# Patient Record
Sex: Male | Born: 1955 | Race: Black or African American | Hispanic: No | Marital: Married | Smoking: Former smoker
Health system: Southern US, Community
[De-identification: ages and names within clinical notes are randomized; demographics above are authoritative.]

## PROBLEM LIST (undated history)

## (undated) ENCOUNTER — Encounter

## (undated) ENCOUNTER — Other Ambulatory Visit

## (undated) ENCOUNTER — Telehealth

## (undated) ENCOUNTER — Inpatient Hospital Stay

## (undated) DIAGNOSIS — H269 Unspecified cataract: Secondary | ICD-10-CM

## (undated) DIAGNOSIS — E785 Hyperlipidemia, unspecified: Secondary | ICD-10-CM

## (undated) DIAGNOSIS — B351 Tinea unguium: Secondary | ICD-10-CM

## (undated) DIAGNOSIS — H409 Unspecified glaucoma: Secondary | ICD-10-CM

## (undated) DIAGNOSIS — I10 Essential (primary) hypertension: Secondary | ICD-10-CM

## (undated) DIAGNOSIS — R1900 Intra-abdominal and pelvic swelling, mass and lump, unspecified site: Secondary | ICD-10-CM

## (undated) HISTORY — DX: Intra-abdominal and pelvic swelling, mass and lump, unspecified site: R19.00

## (undated) HISTORY — DX: Tinea unguium: B35.1

## (undated) HISTORY — PX: APPENDECTOMY: SHX54

## (undated) HISTORY — DX: Unspecified glaucoma: H40.9

## (undated) HISTORY — DX: Unspecified cataract: H26.9

## (undated) HISTORY — DX: Hyperlipidemia, unspecified: E78.5

## (undated) HISTORY — PX: CATARACT EXTRACTION: SUR2

## (undated) HISTORY — DX: Essential (primary) hypertension: I10

## (undated) HISTORY — PX: TONSILLECTOMY: SHX5217

---

## 1993-04-28 HISTORY — PX: APPENDECTOMY: SHX54

## 1998-04-28 HISTORY — PX: COLONOSCOPY: SHX174

## 2001-04-28 HISTORY — PX: CATARACT EXTRACTION: SUR2

## 2016-02-09 DIAGNOSIS — E785 Hyperlipidemia, unspecified: Principal | ICD-10-CM

## 2016-02-11 MED ORDER — NIACIN ER (ANTIHYPERLIPIDEMIC) 750 MG PO TBCR
1 refills
Start: 2016-02-11 — End: ?

## 2016-02-11 MED ORDER — SIMVASTATIN 40 MG PO TABS
1 refills
Start: 2016-02-11 — End: ?

## 2016-02-13 ENCOUNTER — Ambulatory Visit: Attending: Family | Primary: Family Medicine

## 2016-02-13 DIAGNOSIS — Z125 Encounter for screening for malignant neoplasm of prostate: Secondary | ICD-10-CM

## 2016-02-13 DIAGNOSIS — I10 Essential (primary) hypertension: Principal | ICD-10-CM

## 2016-02-13 DIAGNOSIS — Z0000 Encounter for general adult medical examination without abnormal findings: Secondary | ICD-10-CM

## 2016-02-13 DIAGNOSIS — E7800 Pure hypercholesterolemia, unspecified: Principal | ICD-10-CM

## 2016-02-13 DIAGNOSIS — E785 Hyperlipidemia, unspecified: Secondary | ICD-10-CM

## 2016-02-13 NOTE — Progress Notes
Reason:  Physician ordered labs  Amount:  3 Tubes  Type:  Butterfly  Site:  Vein  right arm  Reaction:  None    Draw performed by:  [id number],  02/13/2016

## 2016-02-13 NOTE — Progress Notes
No teaching statement needed

## 2016-02-18 DIAGNOSIS — I10 Essential (primary) hypertension: Principal | ICD-10-CM

## 2016-02-18 MED ORDER — HYDROCHLOROTHIAZIDE 12.5 MG PO CAPS
1 refills | Status: CP
Start: 2016-02-18 — End: 2017-06-08

## 2016-02-18 MED ORDER — LOSARTAN POTASSIUM 100 MG PO TABS
1 refills | Status: CP
Start: 2016-02-18 — End: 2017-06-08

## 2016-02-19 ENCOUNTER — Ambulatory Visit: Attending: Family Medicine | Primary: Family Medicine

## 2016-02-19 DIAGNOSIS — Z0000 Encounter for general adult medical examination without abnormal findings: Principal | ICD-10-CM

## 2016-02-19 DIAGNOSIS — Z6834 Body mass index (BMI) 34.0-34.9, adult: Secondary | ICD-10-CM

## 2016-02-19 DIAGNOSIS — Z1211 Encounter for screening for malignant neoplasm of colon: Secondary | ICD-10-CM

## 2016-02-19 DIAGNOSIS — J309 Allergic rhinitis, unspecified: Secondary | ICD-10-CM

## 2016-02-19 DIAGNOSIS — I10 Essential (primary) hypertension: Secondary | ICD-10-CM

## 2016-02-19 DIAGNOSIS — E785 Hyperlipidemia, unspecified: Secondary | ICD-10-CM

## 2016-02-19 DIAGNOSIS — B354 Tinea corporis: Secondary | ICD-10-CM

## 2016-02-19 MED ORDER — CLOTRIMAZOLE-BETAMETHASONE 1-0.05 % EX CREA
1 refills | Status: CP
Start: 2016-02-19 — End: 2017-06-08

## 2016-02-19 NOTE — Patient Instructions
Initiate daily nasal steroid for the next 2 weeks (Flonase/Nasonex) to reduce inflammation and provide symptomatic relief of allergies.     Exercising to Lose Weight  Exercising can help you to lose weight. In order to lose weight through exercise, you need to do vigorous-intensity exercise. You can tell that you are exercising with vigorous intensity if you are breathing very hard and fast and cannot hold a conversation while exercising.  Moderate-intensity exercise helps to maintain your current weight. You can tell that you are exercising at a moderate level if you have a higher heart rate and faster breathing, but you are still able to hold a conversation.  HOW OFTEN SHOULD I EXERCISE?  Choose an activity that you enjoy and set realistic goals. Your health care provider can help you to make an activity plan that works for you. Exercise regularly as directed by your health care provider. This may include:  ? Doing resistance training twice each week, such as:    Push-ups.    Sit-ups.    Lifting weights.    Using resistance bands.  ? Doing a given intensity of exercise for a given amount of time. Choose from these options:    150 minutes of moderate-intensity exercise every week.    75 minutes of vigorous-intensity exercise every week.    A mix of moderate-intensity and vigorous-intensity exercise every week.  Children, pregnant women, people who are out of shape, people who are overweight, and older adults may need to consult a health care provider for individual recommendations. If you have any sort of medical condition, be sure to consult your health care provider before starting a new exercise program.  WHAT ARE SOME ACTIVITIES THAT CAN HELP ME TO LOSE WEIGHT?   ? Walking at a rate of at least 4.5 miles an hour.  ? Jogging or running at a rate of 5 miles per hour.  ? Biking at a rate of at least 10 miles per hour.  ? Lap swimming.  ? Roller-skating or in-line skating.  ? Cross-country skiing.

## 2016-02-19 NOTE — Patient Instructions
Initiate nasal steroid (Flonase/Nasonex) to reduce inflammation and provide symptomatic relief of allergies.     Exercising to Lose Weight  Exercising can help you to lose weight. In order to lose weight through exercise, you need to do vigorous-intensity exercise. You can tell that you are exercising with vigorous intensity if you are breathing very hard and fast and cannot hold a conversation while exercising.  Moderate-intensity exercise helps to maintain your current weight. You can tell that you are exercising at a moderate level if you have a higher heart rate and faster breathing, but you are still able to hold a conversation.  HOW OFTEN SHOULD I EXERCISE?  Choose an activity that you enjoy and set realistic goals. Your health care provider can help you to make an activity plan that works for you. Exercise regularly as directed by your health care provider. This may include:  ? Doing resistance training twice each week, such as:    Push-ups.    Sit-ups.    Lifting weights.    Using resistance bands.  ? Doing a given intensity of exercise for a given amount of time. Choose from these options:    150 minutes of moderate-intensity exercise every week.    75 minutes of vigorous-intensity exercise every week.    A mix of moderate-intensity and vigorous-intensity exercise every week.  Children, pregnant women, people who are out of shape, people who are overweight, and older adults may need to consult a health care provider for individual recommendations. If you have any sort of medical condition, be sure to consult your health care provider before starting a new exercise program.  WHAT ARE SOME ACTIVITIES THAT CAN HELP ME TO LOSE WEIGHT?   ? Walking at a rate of at least 4.5 miles an hour.  ? Jogging or running at a rate of 5 miles per hour.  ? Biking at a rate of at least 10 miles per hour.  ? Lap swimming.  ? Roller-skating or in-line skating.  ? Cross-country skiing.

## 2016-02-19 NOTE — Patient Instructions
?   Vigorous competitive sports, such as football, basketball, and soccer.  ? Jumping rope.  ? Aerobic dancing.  HOW CAN I BE MORE ACTIVE IN MY DAY-TO-DAY ACTIVITIES?  ? Use the stairs instead of the elevator.  ? Take a walk during your lunch break.  ? If you drive, park your car farther away from work or school.  ? If you take public transportation, get off one stop early and walk the rest of the way.  ? Make all of your phone calls while standing up and walking around.  ? Get up, stretch, and walk around every 30 minutes throughout the day.  WHAT GUIDELINES SHOULD I FOLLOW WHILE EXERCISING?  ? Do not exercise so much that you hurt yourself, feel dizzy, or get very short of breath.  ? Consult your health care provider prior to starting a new exercise program.  ? Wear comfortable clothes and shoes with good support.  ? Drink plenty of water while you exercise to prevent dehydration or heat stroke. Body water is lost during exercise and must be replaced.  ? Work out until you breathe faster and your heart beats faster.     This information is not intended to replace advice given to you by your health care provider. Make sure you discuss any questions you have with your health care provider.     Document Released: 05/17/2010 Document Revised: 05/05/2014 Document Reviewed: 09/15/2013  Elsevier Interactive Patient Education ?2017 [employer]

## 2016-02-19 NOTE — Patient Instructions
Exercising to Lose Weight  Exercising can help you to lose weight. In order to lose weight through exercise, you need to do vigorous-intensity exercise. You can tell that you are exercising with vigorous intensity if you are breathing very hard and fast and cannot hold a conversation while exercising.  Moderate-intensity exercise helps to maintain your current weight. You can tell that you are exercising at a moderate level if you have a higher heart rate and faster breathing, but you are still able to hold a conversation.  HOW OFTEN SHOULD I EXERCISE?  Choose an activity that you enjoy and set realistic goals. Your health care provider can help you to make an activity plan that works for you. Exercise regularly as directed by your health care provider. This may include:  ? Doing resistance training twice each week, such as:    Push-ups.    Sit-ups.    Lifting weights.    Using resistance bands.  ? Doing a given intensity of exercise for a given amount of time. Choose from these options:    150 minutes of moderate-intensity exercise every week.    75 minutes of vigorous-intensity exercise every week.    A mix of moderate-intensity and vigorous-intensity exercise every week.  Children, pregnant women, people who are out of shape, people who are overweight, and older adults may need to consult a health care provider for individual recommendations. If you have any sort of medical condition, be sure to consult your health care provider before starting a new exercise program.  WHAT ARE SOME ACTIVITIES THAT CAN HELP ME TO LOSE WEIGHT?   ? Walking at a rate of at least 4.5 miles an hour.  ? Jogging or running at a rate of 5 miles per hour.  ? Biking at a rate of at least 10 miles per hour.  ? Lap swimming.  ? Roller-skating or in-line skating.  ? Cross-country skiing.  ? Vigorous competitive sports, such as football, basketball, and soccer.  ? Jumping rope.  ? Aerobic dancing.

## 2016-02-19 NOTE — Patient Instructions
HOW CAN I BE MORE ACTIVE IN MY DAY-TO-DAY ACTIVITIES?  ? Use the stairs instead of the elevator.  ? Take a walk during your lunch break.  ? If you drive, park your car farther away from work or school.  ? If you take public transportation, get off one stop early and walk the rest of the way.  ? Make all of your phone calls while standing up and walking around.  ? Get up, stretch, and walk around every 30 minutes throughout the day.  WHAT GUIDELINES SHOULD I FOLLOW WHILE EXERCISING?  ? Do not exercise so much that you hurt yourself, feel dizzy, or get very short of breath.  ? Consult your health care provider prior to starting a new exercise program.  ? Wear comfortable clothes and shoes with good support.  ? Drink plenty of water while you exercise to prevent dehydration or heat stroke. Body water is lost during exercise and must be replaced.  ? Work out until you breathe faster and your heart beats faster.     This information is not intended to replace advice given to you by your health care provider. Make sure you discuss any questions you have with your health care provider.     Document Released: 05/17/2010 Document Revised: 05/05/2014 Document Reviewed: 09/15/2013  Elsevier Interactive Patient Education ?2017 Else[employer] Inc.

## 2016-02-22 NOTE — Progress Notes
?   clotrimazole-betamethasone (LOTRISONE) 1-0.05 % Cream     Sig: Apply topically 2 times daily     Dispense:  1 Tube     Refill:  1     Orders Placed This Encounter   Procedures   ? IMMUNOCHEMICAL FECAL OCCULT BLOOD-SCREEN     Plan     Encounter for annual physical exam    Hyperlipidemia  Will discontinue current medication regimen and check lipid panel q3 months. Patient encouraged to exercise and reduce CV risk factors.     Essential hypertension  Antihypertensive medications are needed if systolic BP is persistently ?140 mmHg (in patients <60 years) or ?150 mmHg (in patients > 60 years) and/or the diastolic pressure is persistently ?90 mmHg despite attempted nonpharmacologic therapy. Today, the patient's blood pressure is at goal. Will continue to encourage lifestyle modifications and will discontinue medication regimen at this time. Will continue to monitor closely.    Allergic rhinitis  Reccomended the pt initiate using daily nasal steroid (Flonase) q2 weeks to reduce inflammation and provide symptomatic relief.     Colon cancer screening  The risks, benefits and alternatives of a colonoscopy were explained in detail to the patient. Risks including but not limited to bleeding, perforation, adverse reaction to medications, cardiopulmonary compromise and their attendant sequalae explained. Sequelae including but not limited to need for surgery, colostomy, prolonged hospital stay, placement of drainage tubes, blood transfusions, disability, deformity, morbidity and mortality was explained. The risks and benefits of FIT testing were discussed and the patient would like to proceed with the annual less invasive screening option.     Tinea corporis  Initiated topical Lotrisone for management of suspected fungal infection and to reduce inflammation to provide symptomatic relief.     BMI 34.0-34.9, adult   Discussed benefits of maintaining a healthy weight with the patient.

## 2016-02-22 NOTE — Progress Notes
work), pruritic rash on right thigh     Physical Exam        VITAL SIGNS (all recorded)      Clinic Vitals       02/19/16 1425             Amb Encounter Vitals    Weight 111.1 kg (245 lb)    -CJ at 02/19/16 1426       Height 1.803 m (5\' 11" )    -CJ at 02/19/16 1426       BMI (Calculated) 34.24    -CJ at 02/19/16 1426       BSA (Calculated - sq m) 2.36    -CJ at 02/19/16 1426       BP 117/78    -CJ at 02/19/16 1426       BP Location Right upper arm    -CJ at 02/19/16 1426       Position Sitting    -CJ at 02/19/16 1426       Pulse 69    -CJ at 02/19/16 1426       Pulse Source Other (Comment)    -CJ at 02/19/16 1426       Pulse Quality Normal    -CJ at 02/19/16 1426       Resp 16    -CJ at 02/19/16 1426       Respiration Quality Normal    -CJ at 02/19/16 1426       Temp 36.2 ?C (97.2 ?F)    -CJ at 02/19/16 1426       Temperature Source Oral    -CJ at 02/19/16 1426       O2 Saturation 97 %    -CJ at 02/19/16 1426       FiO2 Source RA    -CJ at 02/19/16 1426       Pain Score Zero    -CJ at 02/19/16 1426       Education/Communication Barriers?    Learning/Communication Barriers? No    -CJ at 02/19/16 1426       Fall Risk Assessment    Had recent fall / Last 6 months? No recent fall    -CJ at 02/19/16 1426       Does patient have a fear of falling? No    -CJ at 02/19/16 1426         User Key  (r) = Recorded By, (t) = Taken By, (c) = Cosigned By    Initials Name Effective Dates    Colin InaJ Jackson, [place], [place] 07/31/15 -           Physical Exam  Constitution: Well developed, obese, appears stated age, ambulated from waiting area in no acute distress   HEENT: Lids without erythema or lesion, conjunctivae pink, sclera white. External ears symmetric without lesions or deformity. Hears soft spoken voice without difficulty, nares patent with pink mucosa, no discharge or sinus tenderness. Oropharynx without lesions, erythema or exudate. Bilateral swollen turbinates.   NECK: Supple without stridor, thyromegaly, nodules or masses.

## 2016-02-22 NOTE — Progress Notes
HPI      Jaime Navarro is a 60 y.o. male with a history of HPLD and HTN here for PHYSICAL EXAM    Jaime Navarro has been adherent to medications but has not exercised the recommended 150 minutes per week. Jaime Navarro does drink alcohol; averaging <14 drinks per week. 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 In terms of cessation,Jaime Navarro is ceased smoking years ago. Abdominal Aortic Aneurysm Screening has not been completed secondary to history of tobacco abuse (U/S recommended in former smokers ages 5165-75).      Jaime Navarro has not complained of a depressed mood. Recent HIV screening (recommended for patients ages 815-65), Hep C screening (recommended for patients ages 240-65), and STD screening has been completed. If warranted, will obtain labs and perform necessary counseling.    Colorectal screening is not up-to-date (ie. colonoscopy in last 10 years; FIT or FOBT  In last year). The pt reports a family history of colon cancer in two first degree relatives (father and brother). He denies any blood in stools, abdominal pain, or n/v/d/c.      Jaime Navarro does not have a completed Living Will.    An annual flu vaccine (> age 62) has not been given. A pneumonia vaccine (> age 62) has not been given.    Currently, the patient is in a relationship and does not report physical abuse.    The pt has a history of elevated blood pressure. In clinic today the pt's blood pressure was 117/78, which is within the normal range. He states that he is compliant with his Losartan 100 mg and HCTz 12.5 mg. Otherwise the pt denies any chest pain, chest pressure, shortness of breath, palpitations, or syncope.    The pt has a history of hyperlipidemia and hypertriglyceridemia. The pt states that he ran out of his Simvastatin and Niacin 750 mg about 10 days prior to his lab draw and would like to know if he needs to continue his

## 2016-02-22 NOTE — Progress Notes
medications at this time. He denies any muscle pain associated with his medications.     He complains of a moderate to severe pruritic rash located on the medial aspect of his right thigh.    Pain level and fall risk were assessed. The patient did not report issues with incontinence. The pt denies any testicular pain/swelling or scrotal pain/swelling. Health issues since the last visit were reviewed.    Past Medical History:   Diagnosis Date   ? Hypertension      Past Surgical History:   Procedure Laterality Date   ? APPENDECTOMY     ? EYE SURGERY Bilateral     cataracts     Family History   Problem Relation Age of Onset   ? Cancer Father      prostate   ? Cancer Brother      prostate     Social History   Substance Use Topics   ? Smoking status: Former Smoker   ? Smokeless tobacco: Not on file   ? Alcohol use Yes     Current Outpatient Prescriptions on File Prior to Visit   Medication Sig   ? hydroCHLOROthiazide (MICROZIDE) 12.5 MG Capsule TAKE 1 CAPSULE BY MOUTH DAILY.   ? latanoprost (XALATAN) 0.005 % ophthalmic solution nightly at bedtime.   ? losartan (COZAAR) 100 MG Tablet TAKE 1 TABLET BY MOUTH DAILY.   ? [DISCONTINUED] losartan-hydrochlorothiazide (HYZAAR) 100-12.5 MG Tablet Take 1 Tablet by mouth daily.   ? niacin (NIASPAN) 750 MG Tablet Extended Release Take 1 Tablet by mouth nightly at bedtime.   ? [DISCONTINUED] Niacin-Simvastatin [facility](SIMCOR) 500-40 MG Tablet Extended Release 24 Hour Take 1 Tablet by mouth daily.   ? simvastatin (ZOCOR) 40 MG Tablet Take 1 Tablet by mouth every evening.   ? timolol maleate (TIMOPTIC) 0.5 % ophthalmic solution      No current facility-administered medications on file prior to visit.      Allergies   Allergen Reactions   ? No Known Drug Allergy      No Known Drug Allergies     Review of Systems   Per subjective; No CP, SOB, palpitations, syncope, n/v/f/c/d, dysuria, edema, numbness, or weakness  + rhinorrhea, postnasal drip over the past few months (painting house/yard

## 2016-02-22 NOTE — Progress Notes
RESPIRATORY: Non labored breathing, symmetric chest wall movement, No wheezes, rales or rhonchi.   CV: nondisplaced PMI, RRR with normal S1/S2, no S3 or S4, no murmur, gallops, or rubs: no JVD; no abnormal pulsations, no pedal edema   ABD: soft, nondistended, with normoactive bowel sounds. No hepatosplenomegaly, no masses, nontender to deep palpation.   MUSCULOSKELETAL: Normal gait, spine without deformity, good muscle tone and strength; no swelling, tenderness, limitation of motion of any joint.   SKIN: warm and well perfused. Hyperpigmented scaly rash noted on R medial thigh  Back: no thoraco/lumber spine tenderness, no CVAT, FROM   NEUROLOGIC: CN: II-XII grossly intact.   LYMPHATIC: no neck, axillary, or inguinal lymphadenopathy noted.  Psyche: oriented to time, person, place; normal speech and content; appropriate mood and affect; interactive and responsive; memory, judgment, and insight are intact  PHQ-9 Findings  Little interest or pleasure in doing things: Not at all  Feeling down, depressed, or hopeless: Not at all  Trouble falling or staying asleep, or sleeping too much: Not at all  Feeling tired or having little energy: Not at all  Poor appetite or overeating: Not at all  Feeling bad about yourself - or that you are a failure or have let yourself or your family down: Not at all  Trouble concentrating on things, such as reading the newspaper or watching television: Not at all  Moving or speaking so slowly that other people could have noticed. Or the opposite - being so fidgety or restless that you have been moving around a lot more than usual: Not at all  PHQ-9 Thoughts that you would be better off dead, or of hurting yourself in some way: Not at all  If you checked off any problems, how difficult have these problems made it for you to do your work, take care of things at home, or get along with other people?: Not difficult at all  If you checked off any problems, how difficult have these problems made it

## 2016-02-22 NOTE — Progress Notes
for you to do your work, take care of things at home, or get along with other people?: Not difficult at all  PHQ-0 Score  PHQ-9 Total Score: 0    CBC (with or without Differential):   Lab Results   Component Value Date    WBC 5.1 02/13/2016    HGB 13.2 02/13/2016    HCT 38.5 02/13/2016    MCV 91.4 02/13/2016    MCH 31.4 02/13/2016    MCHC 34.3 02/13/2016    RDW 12.4 02/13/2016    PLATCOUNT 139 (L) 02/13/2016    MPV 12.7 (H) 02/13/2016    NEUTROPCT 52.9 01/12/2015    LYMPHPCT 35.4 01/12/2015    MONOPCT 7.8 01/12/2015    EOSPCT 3.7 01/12/2015    BASOPCT 0.2 01/12/2015   , BMP/CMP:   Lab Results   Component Value Date    NA 142 02/13/2016    K 3.9 02/13/2016    CL 106 02/13/2016    CO2 24 02/13/2016    BUN 18 02/13/2016    CREATININE 0.92 02/13/2016    GLU 98 02/13/2016    CALCIUM 9.1 02/13/2016    ALB 4.1 02/13/2016    AST 16 02/13/2016    ALT 13 02/13/2016    EGFR 89 02/13/2016    TBILI 0.5 02/13/2016    ALKPHOS 86 02/13/2016   , PSA:   Lab Results   Component Value Date    PSA 1.1 02/13/2016   , Lipid Profile    Lab Results   Component Value Date/Time    CHOL 205 (H) 02/13/2016 10:25 AM    TRIG 95 02/13/2016 10:25 AM    HDL 54 02/13/2016 10:25 AM    LDL 131 (H) 02/13/2016 10:25 AM   , Thyroid Studies    Lab Results   Component Value Date    TSH 1.84 02/13/2016    FREET4 1.2 02/13/2016    and HGBA1C:    Lab Results   Component Value Date/Time    HGBA1C 5.9 (H) 01/12/2015 11:33 AM     Assessment       ICD-10-CM ICD-9-CM    1. Encounter for annual physical exam Z00.00 V70.0    2. Colon cancer screening Z12.11 V76.51 IMMUNOCHEMICAL FECAL OCCULT BLOOD-SCREEN   3. Essential hypertension I10 401.9    4. Hyperlipidemia, unspecified hyperlipidemia type E78.5 272.4    5. Acute allergic rhinitis, unspecified seasonality, unspecified trigger J30.9 477.9    6. BMI 34.0-34.9,adult Z68.34 V85.34    7. Tinea corporis B35.4 110.5 clotrimazole-betamethasone (LOTRISONE) 1-0.05 % Cream     Orders Placed This Encounter   Medications

## 2016-02-22 NOTE — Progress Notes
Behavior modification, exercise, and dietary changes were encouraged. Will consider pharmacologic therapy for patients with a BMI greater than 30 kg/m2 that have failed to lose weight with diet and exercise alone.    Reviewed previous labs/diagnostics/notes with patient. The necessary screening labs/exams were ordered.    Discussed plan of care with patient. Patient is also advised to various treatment options and agreed to the following: taking medication as directed, keeping follow up visits. Risks verses benefits of medication and treatment were discussed. Patient is instructed to maintain suggested activity level (150 minutes of moderate intensity exercise per week) and  to adopt a healthy diet. Patient voiced agreement and understanding.    Health Maintenance was reviewed. The patient's HM Topic list was:                                            Health Maintenance   Topic Date Due   ? Zoster Vaccine (1) 03/26/2016 (Originally 12/22/2013)   ? Lipid Profile  02/12/2017   ? Basic Metabolic Panel  02/12/2017   ? Preventive Wellness Visit  02/18/2017   ? Colon Cancer Screening  02/18/2017   ? DTaP,Tdap,and Td Vaccines (2 - Td) 10/19/2021   ? USPSTF HIV Risk Assessment  Addressed   ? USPSTF Hepatitis C Screening  Addressed   ? Influenza Vaccine  Addressed     F/u as scheduled or sooner as needed.     Scribe Attestation: I, [employer] PipelineJasmine Angers, have acted as a Neurosurgeonscribe for Robb MatarJeremy Latron Coleman, MD 2:38 PM 02/19/2016     Physician Attestation: Marland Kitchen***

## 2016-02-22 NOTE — Progress Notes
Behavior modification, exercise, and dietary changes were encouraged. Will consider pharmacologic therapy for patients with a BMI greater than 30 kg/m2 that have failed to lose weight with diet and exercise alone.    Reviewed previous labs/diagnostics/notes with patient. The necessary screening labs/exams were ordered.    Discussed plan of care with patient. Patient is also advised to various treatment options and agreed to the following: taking medication as directed, keeping follow up visits. Risks verses benefits of medication and treatment were discussed. Patient is instructed to maintain suggested activity level (150 minutes of moderate intensity exercise per week) and  to adopt a healthy diet. Patient voiced agreement and understanding.    Health Maintenance was reviewed. The patient's HM Topic list was:                                            Health Maintenance   Topic Date Due   ? Zoster Vaccine (1) 03/26/2016 (Originally 12/22/2013)   ? Lipid Profile  02/12/2017   ? Basic Metabolic Panel  02/12/2017   ? Preventive Wellness Visit  02/18/2017   ? Colon Cancer Screening  02/18/2017   ? DTaP,Tdap,and Td Vaccines (2 - Td) 10/19/2021   ? USPSTF HIV Risk Assessment  Addressed   ? USPSTF Hepatitis C Screening  Addressed   ? Influenza Vaccine  Addressed     F/u as scheduled or sooner as needed.     Scribe Attestation: I, [employer] PipelineJasmine Angers, have acted as a Neurosurgeonscribe for Robb MatarJeremy Latron Coleman, MD 2:38 PM 02/19/2016     Physician Attestation: I have reviewed and confirmed the information stated by the scribe and made corrections and edits as appropriate. Despite these efforts, minor errors may be noted. I have personally provided the services documented by the scribe.   ?  Riki RuskJeremy L. Effie Shyoleman, MD  ?

## 2016-09-17 DIAGNOSIS — I10 Essential (primary) hypertension: Secondary | ICD-10-CM

## 2017-06-08 ENCOUNTER — Ambulatory Visit: Attending: Family Medicine | Primary: Family Medicine

## 2017-06-08 DIAGNOSIS — Z6835 Body mass index (BMI) 35.0-35.9, adult: Secondary | ICD-10-CM

## 2017-06-08 DIAGNOSIS — I10 Essential (primary) hypertension: Secondary | ICD-10-CM

## 2017-06-08 DIAGNOSIS — Z125 Encounter for screening for malignant neoplasm of prostate: Secondary | ICD-10-CM

## 2017-06-08 DIAGNOSIS — E6609 Other obesity due to excess calories: Secondary | ICD-10-CM

## 2017-06-08 DIAGNOSIS — E7800 Pure hypercholesterolemia, unspecified: Principal | ICD-10-CM

## 2017-06-08 DIAGNOSIS — Z0000 Encounter for general adult medical examination without abnormal findings: Secondary | ICD-10-CM

## 2017-06-12 NOTE — Progress Notes
GLU 95 06/08/2017    CALCIUM 8.9 06/08/2017    ALB 4.0 06/08/2017    AST 20 06/08/2017    ALT 23 06/08/2017    EGFR 87 06/08/2017    TBILI 0.7 06/08/2017    ALKPHOS 98 06/08/2017   , PSA:   Lab Results   Component Value Date    PSA 1.5 06/08/2017   , Lipid Profile    Lab Results   Component Value Date/Time    CHOL 221 (H) 06/08/2017 12:00 AM    TRIG 113 06/08/2017 12:00 AM    HDL 45 06/08/2017 12:00 AM    LDL 153 (H) 06/08/2017 12:00 AM   , Thyroid Studies    Lab Results   Component Value Date    TSH 1.32 06/08/2017    FREET4 1.2 06/08/2017    and HGBA1C:    Lab Results   Component Value Date/Time    HGBA1C 5.9 (H) 01/12/2015 11:33 AM             Assessment       ICD-10-CM ICD-9-CM    1. Encounter for preventative adult health care examination Z00.00 V70.0    2. Pure hypercholesterolemia E78.00 272.0 Lipid Panel   3. Essential hypertension I10 401.9 CBC AND DIFFERENTIAL      Comprehensive Metabolic Panel - Therapy Plan      TSH+Free T4      CBC AND DIFFERENTIAL      Comprehensive Metabolic Panel - Therapy Plan      TSH+Free T4   4. Body mass index 35.0-35.9, adult Z68.35 V85.35    5. Class 2 obesity due to excess calories without serious comorbidity with body mass index (BMI) of 35.0 to 35.9 in adult E66.09 278.00     Z68.35 V85.35    6. Screening PSA (prostate specific antigen) Z12.5 V76.44 PSA (SCREENING)      PSA (SCREENING)     No orders of the following type(s) were placed in this encounter: Medications.     Orders Placed This Encounter   Procedures   ? Lipid Panel   ? Comprehensive Metabolic Panel - Therapy Plan   ? TSH+Free T4   ? PSA (SCREENING)       Plan     Encounter for preventative adult health care examination    Pre-diabetes  Patient's glucose control is well controlled with diet/exercise.    Pure hypercholesterolemia  Cholesterol is out of normal ranges; start low cholesterol diet,maintain physical activity (150 mins/week of moderate intensity activity, and start OTC fish oil.  -     Lipid Panel

## 2017-06-12 NOTE — Progress Notes
Reason:  Physician ordered labs  Amount:  3Tubes  Type:  Butterfly  Site:  Vein  right arm  Reaction:  None    Draw performed by:  [id number],  06/08/2017

## 2017-06-12 NOTE — Progress Notes
HPI      Jaime Navarro is a 62 y.o. male with a history of HPLD, glaucoma and HTN  here for PHYSICAL EXAM    Jaime Navarro has been adherent to medications but has not exercised the recommended 150 minutes per week. Jaime Navarro does drink alcohol; averaging <14 drinks per week. 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 In terms of cessation,Jaime Navarro is ceased smoking years ago. Abdominal Aortic Aneurysm Screening has not been completed secondary to history of tobacco abuse (U/S recommended in former smokers ages 4865-75).      Jaime Navarro has not complained of a depressed mood. Recent HIV screening (recommended for patients ages 8015-65), Hep C screening (recommended for patients ages 2840-65), and STD screening has been completed. If warranted, will obtain labs and perform necessary counseling.    Colorectal screening is not up-to-date (ie. colonoscopy in last 10 years; FIT or FOBT  In last year). Jaime Navarro does not have a completed Living Will.    An annual flu vaccine (> age 64) has been given. A pneumonia vaccine (> age 64) has been given.    Currently, the patient is in a relationship and does not report physical abuse.    Jaime Navarro is here for a new concern for pulsating in his left ear. He describes the noise as "thumping and pulsating" noises. Patient denies any pain.     Patient has a h/o og glaucoma, but his most recent IOP was between 8 and 10.      Pain level and fall risk were assessed. The patient did not report issues with incontinence. Health issues since the last visit were reviewed.    Past Medical History:   Diagnosis Date   ? Hypertension      Past Surgical History:   Procedure Laterality Date   ? APPENDECTOMY     ? EYE SURGERY Bilateral     cataracts     Family History   Problem Relation Age of Onset   ? Cancer Father         prostate   ? Cancer Brother         prostate     Social History   Substance Use Topics   ? Smoking status: Former Smoker

## 2017-06-12 NOTE — Progress Notes
-   I ordered Lipid panel     Essential hypertension  Antihypertensive medications are needed if systolic BP is persistently ?140 mmHg (in patients <60 years) or ?150 mmHg (in patients > 60 years) and/or the diastolic pressure is persistently ?90 mmHg despite attempted nonpharmacologic therapy. Today, the patient's blood pressure is at goal. Will continue to encourage lifestyle modifications to maintain adequate control.  -     CBC AND DIFFERENTIAL; Future  -     Comprehensive Metabolic Panel - Therapy Plan; Future  -     TSH+Free T4; Future    - I ordered a CBC, Chem12, Lipid Profile, and TSH.        Reviewed previous labs/diagnostics/notes with patient. The necessary screening labs/exams were ordered.    Body mass index 35.0-35.9, adult; Class 2 obesity due to excess calories without serious comorbidity with body mass index (BMI) of 35.0 to 35.9 in adult  Discussed plan of care with patient. Patient is also advised to various treatment options and agreed to the following: taking medication as directed, keeping follow up visits. Risks verses benefits of medication and treatment were discussed. Patient is instructed to maintain suggested activity level (150 minutes of moderate intensity exercise per week) and  to adopt a healthy diet. Patient voiced agreement and understanding.  Gloria's Estimated body mass index is 35.29 kg/m? as calculated from the following:    Height as of this encounter: 1.803 m (5\' 11" ).    Weight as of this encounter: 114.8 kg (253 lb).      The health risks associated with an elevated BMI were discussed and education was provided.  See orders for any further follow up plans.      Health Maintenance was reviewed. The patient's HM Topic list was:                                            Health Maintenance   Topic Date Due   ? Colon Cancer Screening  02/19/2017   ? Zoster Vaccine (1 of 2) 06/08/2018 (Originally 12/23/2003)   ? Preventive Wellness Visit  06/08/2018   ? Lipid Profile  06/08/2018

## 2017-06-12 NOTE — Progress Notes
?   Basic Metabolic Panel  06/08/2018   ? DTaP,Tdap,and Td Vaccines (2 - Td) 10/19/2021   ? USPSTF HIV Risk Assessment  Addressed   ? USPSTF Hepatitis C Screening  Addressed   ? Influenza Vaccine  Addressed     Scribe Attestation: I, Clelia SchaumannDiana Ballesteros, have acted as a Neurosurgeonscribe for Robb MatarJeremy Latron Coleman, MD 1:35 PM 06/08/2017     Physician Attestation: I have reviewed and confirmed the information stated by the scribe and made corrections and edits as appropriate. Despite these efforts, minor errors may be noted. I have personally provided the services documented by the scribe.   ?  Riki RuskJeremy L. Effie Shyoleman, MD  ?

## 2017-06-12 NOTE — Progress Notes
Quit date: 09/18/1998   ? Smokeless tobacco: Never Used   ? Alcohol use Yes     Current Outpatient Prescriptions on File Prior to Visit   Medication Sig   ? [DISCONTINUED] clotrimazole-betamethasone (LOTRISONE) 1-0.05 % Cream Apply topically 2 times daily   ? [DISCONTINUED] hydroCHLOROthiazide (MICROZIDE) 12.5 MG Capsule TAKE 1 CAPSULE BY MOUTH DAILY.   ? [DISCONTINUED] latanoprost (XALATAN) 0.005 % ophthalmic solution nightly at bedtime.   ? [DISCONTINUED] losartan (COZAAR) 100 MG Tablet TAKE 1 TABLET BY MOUTH DAILY.   ? [DISCONTINUED] niacin (NIASPAN) 750 MG Tablet Extended Release Take 1 Tablet by mouth nightly at bedtime.   ? [DISCONTINUED] simvastatin (ZOCOR) 40 MG Tablet Take 1 Tablet by mouth every evening.   ? [DISCONTINUED] timolol maleate (TIMOPTIC) 0.5 % ophthalmic solution      No current facility-administered medications on file prior to visit.      Allergies   Allergen Reactions   ? No Known Drug Allergy      No Known Drug Allergies         Review of Systems   Per subjective; No CP, SOB, palpitations, syncope, n/v/f/c/d, dysuria, rash, edema, numbness, or weakness       Physical Exam        VITAL SIGNS (all recorded)      Clinic Vitals     Row Name 06/08/17 1315                Amb Encounter Vitals    Weight 114.8 kg (253 lb)    -AW at 06/08/17 1315       Height 1.803 m (5\' 11" )    -AW at 06/08/17 1315       BMI (Calculated) 35.36    -AW at 06/08/17 1315       BSA (Calculated - sq m) 2.4    -AW at 06/08/17 1315       BP 124/86    -AW at 06/08/17 1319       BP Location Left upper arm    -AW at 06/08/17 1315       Position Sitting    -AW at 06/08/17 1315       Pulse 59    -AW at 06/08/17 1317       Pulse Source Radial    -AW at 06/08/17 1315       Pulse Quality Normal    -AW at 06/08/17 1315       Resp 16    -AW at 06/08/17 1317       Respiration Quality Normal    -AW at 06/08/17 1315       Temp 36.3 ?C (97.3 ?F)    -AW at 06/08/17 1317       Temperature Source Oral    -AW at 06/08/17 1315

## 2017-06-12 NOTE — Progress Notes
?   Preventive Wellness Visit  06/08/2018   ? DTaP,Tdap,and Td Vaccines (2 - Td) 10/19/2021   ? USPSTF HIV Risk Assessment  Addressed   ? USPSTF Hepatitis C Screening  Addressed   ? Influenza Vaccine  Addressed           Scribe Attestation: I, Clelia Schaumanniana Ballesteros, have acted as a Neurosurgeonscribe for Robb MatarJeremy Latron Coleman, MD 1:35 PM 06/08/2017     Physician Attestation: Marland Kitchen***

## 2017-06-12 NOTE — Progress Notes
HPI      Jaime Navarro is a 62 y.o. male with a history of HPLD and HTN  here for PHYSICAL EXAM    Jaime Navarro has been adherent to medications but has not exercised the recommended 150 minutes per week. Jaime Navarro does drink alcohol; averaging <14 drinks per week. 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 In terms of cessation,Jaime Navarro is ceased smoking years ago. Abdominal Aortic Aneurysm Screening has not been completed secondary to history of tobacco abuse (U/S recommended in former smokers ages 265-75).      Jaime Navarro has not complained of a depressed mood. Recent HIV screening (recommended for patients ages 8015-65), Hep C screening (recommended for patients ages 6440-65), and STD screening has been completed. If warranted, will obtain labs and perform necessary counseling.    Colorectal screening is not up-to-date (ie. colonoscopy in last 10 years; FIT or FOBT  In last year). Jaime Navarro does not have a completed Living Will.    An annual flu vaccine (> age 64) has been given. A pneumonia vaccine (> age 64) has been given.    Currently, the patient is in a relationship and does not report physical abuse.    Jaime Navarro is here for a new concern for pulsating in his left ear. He describes the noise as "thumping and pulsating" noises. Patient denies any pain.     Patient has a h/o of IOP 10-8.      Pain level and fall risk were assessed. The patient did not report issues with incontinence. Health issues since the last visit were reviewed.    Past Medical History:   Diagnosis Date   ? Hypertension      Past Surgical History:   Procedure Laterality Date   ? APPENDECTOMY     ? EYE SURGERY Bilateral     cataracts     Family History   Problem Relation Age of Onset   ? Cancer Father         prostate   ? Cancer Brother         prostate     Social History   Substance Use Topics   ? Smoking status: Former Smoker     Packs/day: 0.25     Years: 3.00     Types: Cigarettes

## 2017-06-12 NOTE — Progress Notes
ambulated from waiting area in no acute distress   HEENT:Lids without erythema or lesion, conjunctivae pink, sclera white. External ears symmetric without lesions or deformity. Hears soft spoken voice without difficulty, nares patent with pink mucosa, no discharge or sinus tenderness.  Oropharynx without lesions, erythema or exudate. Fluid behind left ear.   NECK: Supple without stridor, thyromegaly, nodules or masses.   RESPIRATORY: Non labored breathing, symmetric chest wall movement, No wheezes, rales or rhonchi.   CV: nondisplaced PMI, RRR with normal S1/S2, no S3 or S4, no murmur, gallops, or rubs: no JVD; no abnormal pulsations, no pedal edema   ABD: soft, nondistended, with normoactive bowel sounds. No hepatosplenomegaly, no masses, nontender to deep palpation.   MUSCULOSKELETAL: Normal gait, spine without deformity, good muscle tone and strength; no swelling, tenderness, limitation of motion of any joint.   SKIN: warm and well perfused. No rashes or suspicious lesions noted.  Back: no thoraco/lumber spine tenderness, no CVAT, FROM   NEUROLOGIC: CN: II-XII grossly intact.   LYMPHATIC: no neck, axillary, or inguinal lymphadenopathy noted.  Psyche: oriented to time, person, place; normal speech and content; appropriate mood and affect; interactive and responsive; memory, judgment, and insight are intact         CBC (with or without Differential):   Lab Results   Component Value Date    WBC 5.3 06/08/2017    HGB 14.1 06/08/2017    HCT 39.5 06/08/2017    MCV 90.0 06/08/2017    MCH 32.1 06/08/2017    MCHC 35.7 06/08/2017    RDW 12.4 06/08/2017    PLATCOUNT 140 06/08/2017    MPV 12.7 (H) 06/08/2017    NEUTROPCT 52.6 06/08/2017    LYMPHPCT 31.4 06/08/2017    MONOPCT 10.6 06/08/2017    EOSPCT 4.6 06/08/2017    BASOPCT 0.8 06/08/2017   , BMP/CMP:   Lab Results   Component Value Date    NA 143 06/08/2017    K 3.9 06/08/2017    CL 107 06/08/2017    CO2 29 06/08/2017    BUN 13 06/08/2017    CREATININE 0.93 06/08/2017

## 2017-06-12 NOTE — Progress Notes
No orders of the following type(s) were placed in this encounter: Medications.     Orders Placed This Encounter   Procedures   ? Lipid Panel   ? Comprehensive Metabolic Panel - Therapy Plan   ? TSH+Free T4   ? PSA (SCREENING)       Plan     Encounter for preventative adult health care examination      Pure hypercholesterolemia  -     Lipid Panel    - I ordered Lipid panel     Essential hypertension  -     CBC AND DIFFERENTIAL; Future  -     Comprehensive Metabolic Panel - Therapy Plan; Future  -     TSH+Free T4; Future    - I ordered a CBC, Chem12, Lipid Profile, and TSH.        Reviewed previous labs/diagnostics/notes with patient. The necessary screening labs/exams were ordered.    Body mass index 35.0-35.9, adult; Class 2 obesity due to excess calories without serious comorbidity with body mass index (BMI) of 35.0 to 35.9 in adult  Discussed plan of care with patient. Patient is also advised to various treatment options and agreed to the following: taking medication as directed, keeping follow up visits. Risks verses benefits of medication and treatment were discussed. Patient is instructed to maintain suggested activity level (150 minutes of moderate intensity exercise per week) and  to adopt a healthy diet. Patient voiced agreement and understanding.  Moustapha's Estimated body mass index is 35.29 kg/m? as calculated from the following:    Height as of this encounter: 1.803 m (5\' 11" ).    Weight as of this encounter: 114.8 kg (253 lb).      The health risks associated with an elevated BMI were discussed and education was provided.  See orders for any further follow up plans.      Health Maintenance was reviewed. The patient's HM Topic list was:                                            Health Maintenance   Topic Date Due   ? Lipid Profile  02/12/2017   ? Basic Metabolic Panel  02/12/2017   ? Colon Cancer Screening  02/19/2017   ? Zoster Vaccine (1 of 2) 06/08/2018 (Originally 12/23/2003)

## 2017-06-12 NOTE — Progress Notes
O2 Saturation 98 %    -AW at 06/08/17 1317       Pain Score Zero    -AW at 06/08/17 1315          Education/Communication Barriers?    Learning/Communication Barriers? No    -AW at 06/08/17 1315          Fall Risk Assessment    Had recent fall / Last 6 months? No recent fall    -AW at 06/08/17 1315       Does patient have a fear of falling? No    -AW at 06/08/17 1315         User Key  (r) = Recorded By, (t) = Taken By, (c) = Cosigned By    Initials Name Effective Dates    AW Carlus PavlovWinfield, Audrey -          Physical Exam  Constitution: Well developed, well nourished, appears stated age, ambulated from waiting area in no acute distress   HEENT:Lids without erythema or lesion, conjunctivae pink, sclera white. External ears symmetric without lesions or deformity. Hears soft spoken voice without difficulty, nares patent with pink mucosa, no discharge or sinus tenderness.  Oropharynx without lesions, erythema or exudate. Fluid behind left ear.   NECK: Supple without stridor, thyromegaly, nodules or masses.   RESPIRATORY: Non labored breathing, symmetric chest wall movement, No wheezes, rales or rhonchi.   CV: nondisplaced PMI, RRR with normal S1/S2, no S3 or S4, no murmur, gallops, or rubs: no JVD; no abnormal pulsations, no pedal edema   ABD: soft, nondistended, with normoactive bowel sounds. No hepatosplenomegaly, no masses, nontender to deep palpation.   MUSCULOSKELETAL: Normal gait, spine without deformity, good muscle tone and strength; no swelling, tenderness, limitation of motion of any joint.   SKIN: warm and well perfused. No rashes or suspicious lesions noted.  Back: no thoraco/lumber spine tenderness, no CVAT, FROM   NEUROLOGIC: CN: II-XII grossly intact.   LYMPHATIC: no neck, axillary, or inguinal lymphadenopathy noted.  Psyche: oriented to time, person, place; normal speech and content; appropriate mood and affect; interactive and responsive; memory, judgment, and insight are intact

## 2017-06-12 NOTE — Progress Notes
Packs/day: 0.25     Years: 3.00     Types: Cigarettes     Quit date: 09/18/1998   ? Smokeless tobacco: Never Used   ? Alcohol use Yes     No current outpatient prescriptions on file prior to visit.     No current facility-administered medications on file prior to visit.      Allergies   Allergen Reactions   ? No Known Drug Allergy      No Known Drug Allergies         Review of Systems   Per subjective; No CP, SOB, palpitations, syncope, n/v/f/c/d, dysuria, rash, edema, numbness, or weakness       Physical Exam        VITAL SIGNS (all recorded)      Clinic Vitals     Row Name 06/08/17 1315                Amb Encounter Vitals    Weight 114.8 kg (253 lb)    -AW at 06/08/17 1315       Height 1.803 m (5\' 11" )    -AW at 06/08/17 1315       BMI (Calculated) 35.36    -AW at 06/08/17 1315       BSA (Calculated - sq m) 2.4    -AW at 06/08/17 1315       BP 124/86    -AW at 06/08/17 1319       BP Location Left upper arm    -AW at 06/08/17 1315       Position Sitting    -AW at 06/08/17 1315       Pulse 59    -AW at 06/08/17 1317       Pulse Source Radial    -AW at 06/08/17 1315       Pulse Quality Normal    -AW at 06/08/17 1315       Resp 16    -AW at 06/08/17 1317       Respiration Quality Normal    -AW at 06/08/17 1315       Temp 36.3 ?C (97.3 ?F)    -AW at 06/08/17 1317       Temperature Source Oral    -AW at 06/08/17 1315       O2 Saturation 98 %    -AW at 06/08/17 1317       Pain Score Zero    -AW at 06/08/17 1315          Education/Communication Barriers?    Learning/Communication Barriers? No    -AW at 06/08/17 1315          Fall Risk Assessment    Had recent fall / Last 6 months? No recent fall    -AW at 06/08/17 1315       Does patient have a fear of falling? No    -AW at 06/08/17 1315         User Key  (r) = Recorded By, (t) = Taken By, (c) = Cosigned By    Initials Name Effective Dates    AW Carlus PavlovWinfield, Audrey -          Physical Exam  Constitution: Well developed, well nourished, appears stated age,

## 2017-06-12 NOTE — Progress Notes
CBC (with or without Differential):   Lab Results   Component Value Date    WBC 5.1 02/13/2016    HGB 13.2 02/13/2016    HCT 38.5 02/13/2016    MCV 91.4 02/13/2016    MCH 31.4 02/13/2016    MCHC 34.3 02/13/2016    RDW 12.4 02/13/2016    PLATCOUNT 139 (L) 02/13/2016    MPV 12.7 (H) 02/13/2016    NEUTROPCT 52.9 01/12/2015    LYMPHPCT 35.4 01/12/2015    MONOPCT 7.8 01/12/2015    EOSPCT 3.7 01/12/2015    BASOPCT 0.2 01/12/2015   , BMP/CMP:   Lab Results   Component Value Date    NA 142 02/13/2016    K 3.9 02/13/2016    CL 106 02/13/2016    CO2 24 02/13/2016    BUN 18 02/13/2016    CREATININE 0.92 02/13/2016    GLU 98 02/13/2016    CALCIUM 9.1 02/13/2016    ALB 4.1 02/13/2016    AST 16 02/13/2016    ALT 13 02/13/2016    EGFR 89 02/13/2016    TBILI 0.5 02/13/2016    ALKPHOS 86 02/13/2016   , PSA:   Lab Results   Component Value Date    PSA 1.1 02/13/2016   , Lipid Profile    Lab Results   Component Value Date/Time    CHOL 205 (H) 02/13/2016 10:25 AM    TRIG 95 02/13/2016 10:25 AM    HDL 54 02/13/2016 10:25 AM    LDL 131 (H) 02/13/2016 10:25 AM   , Thyroid Studies    Lab Results   Component Value Date    TSH 1.84 02/13/2016    FREET4 1.2 02/13/2016    and HGBA1C:    Lab Results   Component Value Date/Time    HGBA1C 5.9 (H) 01/12/2015 11:33 AM             Assessment       ICD-10-CM ICD-9-CM    1. Pure hypercholesterolemia E78.00 272.0 Lipid Panel   2. Essential hypertension I10 401.9 CBC AND DIFFERENTIAL      Comprehensive Metabolic Panel - Therapy Plan      TSH+Free T4      CBC AND DIFFERENTIAL      Comprehensive Metabolic Panel - Therapy Plan      TSH+Free T4   3. Body mass index 35.0-35.9, adult Z68.35 V85.35    4. Class 2 obesity due to excess calories without serious comorbidity with body mass index (BMI) of 35.0 to 35.9 in adult E66.09 278.00     Z68.35 V85.35    5. Screening PSA (prostate specific antigen) Z12.5 V76.44 PSA (SCREENING)      PSA (SCREENING)

## 2019-08-26 ENCOUNTER — Ambulatory Visit: Admit: 2019-08-26 | Discharge: 2019-08-26

## 2019-08-26 ENCOUNTER — Ambulatory Visit: Admit: 2019-08-26 | Discharge: 2019-08-26 | Attending: Family Medicine

## 2019-08-26 DIAGNOSIS — Z1329 Encounter for screening for other suspected endocrine disorder: Secondary | ICD-10-CM

## 2019-08-26 DIAGNOSIS — I10 Essential (primary) hypertension: Principal | ICD-10-CM

## 2019-08-26 DIAGNOSIS — Z6835 Body mass index (BMI) 35.0-35.9, adult: Secondary | ICD-10-CM

## 2019-08-26 DIAGNOSIS — Z131 Encounter for screening for diabetes mellitus: Secondary | ICD-10-CM

## 2019-08-26 DIAGNOSIS — Z125 Encounter for screening for malignant neoplasm of prostate: Secondary | ICD-10-CM

## 2019-08-26 DIAGNOSIS — Z1211 Encounter for screening for malignant neoplasm of colon: Secondary | ICD-10-CM

## 2019-08-26 MED ORDER — LOSARTAN POTASSIUM 50 MG PO TABS: Start: 2019-08-26 — End: 2019-08-26

## 2019-08-26 MED ORDER — LOSARTAN POTASSIUM 50 MG PO TABS
50 mg | Freq: Every day | ORAL | 3 refills | Status: CP
Start: 2019-08-26 — End: ?

## 2019-08-28 DIAGNOSIS — I10 Essential (primary) hypertension: Principal | ICD-10-CM

## 2019-09-05 ENCOUNTER — Ambulatory Visit: Admit: 2019-09-05 | Discharge: 2019-09-05 | Attending: Family Medicine

## 2019-09-05 DIAGNOSIS — Z0000 Encounter for general adult medical examination without abnormal findings: Principal | ICD-10-CM

## 2019-09-05 DIAGNOSIS — I10 Essential (primary) hypertension: Principal | ICD-10-CM

## 2019-09-05 DIAGNOSIS — Z6835 Body mass index (BMI) 35.0-35.9, adult: Secondary | ICD-10-CM

## 2019-09-05 DIAGNOSIS — M65342 Trigger finger, left ring finger: Secondary | ICD-10-CM

## 2019-09-05 DIAGNOSIS — R7303 Prediabetes: Secondary | ICD-10-CM

## 2019-09-05 DIAGNOSIS — E782 Mixed hyperlipidemia: Secondary | ICD-10-CM

## 2019-09-05 DIAGNOSIS — N528 Other male erectile dysfunction: Secondary | ICD-10-CM

## 2019-09-05 MED ORDER — TADALAFIL 10 MG PO TABS
10 mg | Freq: Every day | ORAL | 5 refills | Status: CP | PRN
Start: 2019-09-05 — End: ?

## 2019-09-05 MED ORDER — SILDENAFIL CITRATE 100 MG PO TABS
100 mg | Freq: Every day | ORAL | Status: CN | PRN
Start: 2019-09-05 — End: ?

## 2019-09-05 MED ORDER — SIMVASTATIN 20 MG PO TABS
20 mg | ORAL_TABLET | Freq: Every evening | ORAL | 3 refills | Status: CN
Start: 2019-09-05 — End: ?

## 2019-12-14 DIAGNOSIS — Z6835 Body mass index (BMI) 35.0-35.9, adult: Secondary | ICD-10-CM

## 2019-12-14 DIAGNOSIS — M65342 Trigger finger, left ring finger: Principal | ICD-10-CM

## 2019-12-14 DIAGNOSIS — S46812A Strain of other muscles, fascia and tendons at shoulder and upper arm level, left arm, initial encounter: Secondary | ICD-10-CM

## 2019-12-14 DIAGNOSIS — I10 Essential (primary) hypertension: Principal | ICD-10-CM

## 2019-12-14 MED ORDER — SILDENAFIL CITRATE 20 MG PO TABS: Start: 2019-12-14 — End: ?

## 2019-12-14 MED ORDER — TIMOLOL MALEATE 0.5 % OP SOLG: Start: 2019-12-14 — End: ?

## 2019-12-14 MED ORDER — DICLOFENAC SODIUM 1 % EX GEL
4 g | Freq: Four times a day (QID) | TOPICAL | 0 refills | 13.00000 days | Status: CP
Start: 2019-12-14 — End: ?

## 2019-12-16 DIAGNOSIS — I10 Essential (primary) hypertension: Principal | ICD-10-CM

## 2020-07-16 ENCOUNTER — Ambulatory Visit: Payer: BLUE CROSS/BLUE SHIELD | Attending: Family Medicine | Primary: Family Medicine

## 2020-07-16 DIAGNOSIS — Z136 Encounter for screening for cardiovascular disorders: Secondary | ICD-10-CM

## 2020-07-16 DIAGNOSIS — I10 Essential (primary) hypertension: Principal | ICD-10-CM

## 2020-07-16 DIAGNOSIS — M65342 Trigger finger, left ring finger: Secondary | ICD-10-CM

## 2020-07-16 DIAGNOSIS — Z125 Encounter for screening for malignant neoplasm of prostate: Secondary | ICD-10-CM

## 2020-07-16 DIAGNOSIS — Z6836 Body mass index (BMI) 36.0-36.9, adult: Secondary | ICD-10-CM

## 2020-07-16 DIAGNOSIS — Z1329 Encounter for screening for other suspected endocrine disorder: Secondary | ICD-10-CM

## 2020-07-16 DIAGNOSIS — Z130 Encounter for screening for diseases of the blood and blood-forming organs and certain disorders involving the immune mechanism: Secondary | ICD-10-CM

## 2020-07-16 DIAGNOSIS — Z1321 Encounter for screening for nutritional disorder: Secondary | ICD-10-CM

## 2020-07-16 DIAGNOSIS — Z13228 Encounter for screening for other metabolic disorders: Secondary | ICD-10-CM

## 2020-07-16 DIAGNOSIS — Z87891 Personal history of nicotine dependence: Secondary | ICD-10-CM

## 2020-07-16 DIAGNOSIS — Z23 Encounter for immunization: Secondary | ICD-10-CM

## 2020-07-16 MED ORDER — LOSARTAN POTASSIUM 100 MG PO TABS
100 mg | Freq: Every day | ORAL | 3 refills | Status: CP
Start: 2020-07-16 — End: ?

## 2020-07-18 ENCOUNTER — Ambulatory Visit: Primary: Family Medicine

## 2020-07-18 DIAGNOSIS — J9801 Acute bronchospasm: Principal | ICD-10-CM

## 2020-07-18 DIAGNOSIS — Z6836 Body mass index (BMI) 36.0-36.9, adult: Secondary | ICD-10-CM

## 2020-07-18 DIAGNOSIS — E7800 Pure hypercholesterolemia, unspecified: Secondary | ICD-10-CM

## 2020-07-18 DIAGNOSIS — I10 Essential (primary) hypertension: Secondary | ICD-10-CM

## 2020-07-22 DIAGNOSIS — I10 Essential (primary) hypertension: Principal | ICD-10-CM

## 2020-07-30 ENCOUNTER — Encounter: Payer: BLUE CROSS/BLUE SHIELD | Primary: Family Medicine

## 2020-07-30 DIAGNOSIS — Z136 Encounter for screening for cardiovascular disorders: Principal | ICD-10-CM

## 2020-07-30 DIAGNOSIS — Z87891 Personal history of nicotine dependence: Secondary | ICD-10-CM

## 2020-07-31 ENCOUNTER — Encounter: Payer: BLUE CROSS/BLUE SHIELD | Primary: Family Medicine

## 2020-07-31 ENCOUNTER — Ambulatory Visit: Payer: BLUE CROSS/BLUE SHIELD | Attending: Family Medicine | Primary: Family Medicine

## 2020-07-31 DIAGNOSIS — Z6836 Body mass index (BMI) 36.0-36.9, adult: Secondary | ICD-10-CM

## 2020-07-31 DIAGNOSIS — I10 Essential (primary) hypertension: Principal | ICD-10-CM

## 2020-07-31 DIAGNOSIS — R7303 Prediabetes: Secondary | ICD-10-CM

## 2020-07-31 DIAGNOSIS — I719 Aortic aneurysm of unspecified site, without rupture: Secondary | ICD-10-CM

## 2020-07-31 DIAGNOSIS — E782 Mixed hyperlipidemia: Secondary | ICD-10-CM

## 2020-07-31 DIAGNOSIS — E559 Vitamin D deficiency, unspecified: Principal | ICD-10-CM

## 2020-07-31 MED ORDER — IOHEXOL 350 MG/ML IV SOLN SH
100 mL | Freq: Once | INTRAVENOUS | Status: CP
Start: 2020-07-31 — End: ?

## 2020-07-31 MED ORDER — SIMVASTATIN 20 MG PO TABS
20 mg | ORAL_TABLET | Freq: Every evening | ORAL
Start: 2020-07-31 — End: ?

## 2020-07-31 MED ORDER — SODIUM CHLORIDE 0.9% FOR FLUSHES
20-180 mL | Status: CP | PRN
Start: 2020-07-31 — End: ?

## 2020-08-05 DIAGNOSIS — I10 Essential (primary) hypertension: Principal | ICD-10-CM

## 2020-08-06 ENCOUNTER — Ambulatory Visit: Payer: BLUE CROSS/BLUE SHIELD | Attending: Family Medicine | Primary: Family Medicine

## 2020-08-06 DIAGNOSIS — I10 Essential (primary) hypertension: Principal | ICD-10-CM

## 2020-08-06 DIAGNOSIS — R1900 Intra-abdominal and pelvic swelling, mass and lump, unspecified site: Secondary | ICD-10-CM

## 2020-08-06 DIAGNOSIS — Z6835 Body mass index (BMI) 35.0-35.9, adult: Secondary | ICD-10-CM

## 2020-08-06 MED ORDER — CLONIDINE HCL 0.1 MG PO TABS: Start: 2020-08-06 — End: ?

## 2020-08-09 ENCOUNTER — Ambulatory Visit: Payer: BLUE CROSS/BLUE SHIELD

## 2020-08-09 ENCOUNTER — Ambulatory Visit: Payer: BLUE CROSS/BLUE SHIELD | Attending: Surgery | Primary: Family Medicine

## 2020-08-09 ENCOUNTER — Inpatient Hospital Stay: Admit: 2020-08-09 | Payer: BLUE CROSS/BLUE SHIELD | Primary: Family Medicine

## 2020-08-09 DIAGNOSIS — D4989 Neoplasm of unspecified behavior of other specified sites: Secondary | ICD-10-CM

## 2020-08-09 DIAGNOSIS — R1900 Intra-abdominal and pelvic swelling, mass and lump, unspecified site: Principal | ICD-10-CM

## 2020-08-09 DIAGNOSIS — I10 Essential (primary) hypertension: Principal | ICD-10-CM

## 2020-08-12 DIAGNOSIS — I10 Essential (primary) hypertension: Principal | ICD-10-CM

## 2020-08-15 ENCOUNTER — Ambulatory Visit: Payer: BLUE CROSS/BLUE SHIELD

## 2020-08-15 DIAGNOSIS — I10 Essential (primary) hypertension: Principal | ICD-10-CM

## 2020-08-15 MED ORDER — ASPIRIN 81 MG PO TBEC
81 mg | Freq: Every day | ORAL
Start: 2020-08-15 — End: ?

## 2020-08-15 MED ORDER — LIDOCAINE HCL (PF) 1 % IJ SOLN SH
INTRAVENOUS | Status: DC | PRN
Start: 2020-08-15 — End: 2020-08-15

## 2020-08-15 MED ORDER — PROPOFOL 10 MG/ML IV EMUL CUSTOM SH
INTRAVENOUS | Status: DC | PRN
Start: 2020-08-15 — End: 2020-08-15

## 2020-08-15 MED ORDER — TAB-A-VITE/BETA CAROTENE PO TABS
ORAL_TABLET | ORAL
Start: 2020-08-15 — End: ?

## 2020-08-15 MED ORDER — FISH OIL 1000 MG PO CAPS
ORAL
Start: 2020-08-15 — End: ?

## 2020-08-15 MED ORDER — LACTATED RINGERS IV SOLN
1000 mL | Freq: Once | INTRAVENOUS
Start: 2020-08-15 — End: ?

## 2020-08-15 MED ORDER — LACTATED RINGERS IV SOLN
INTRAVENOUS | Status: DC | PRN
Start: 2020-08-15 — End: 2020-08-15

## 2020-08-17 DIAGNOSIS — I10 Essential (primary) hypertension: Principal | ICD-10-CM

## 2020-08-21 ENCOUNTER — Encounter: Payer: BLUE CROSS/BLUE SHIELD | Attending: Physician Assistant | Primary: Family Medicine

## 2020-08-27 DIAGNOSIS — Z125 Encounter for screening for malignant neoplasm of prostate: Principal | ICD-10-CM

## 2020-08-31 DIAGNOSIS — I10 Essential (primary) hypertension: Principal | ICD-10-CM

## 2020-09-05 MED ORDER — LOSARTAN POTASSIUM 50 MG PO TABS
3 refills
Start: 2020-09-05 — End: ?

## 2020-10-08 ENCOUNTER — Other Ambulatory Visit: Payer: Self-pay

## 2020-10-08 ENCOUNTER — Ambulatory Visit (INDEPENDENT_AMBULATORY_CARE_PROVIDER_SITE_OTHER): Payer: Medicare Other | Admitting: Internal Medicine

## 2020-10-08 ENCOUNTER — Encounter: Payer: Self-pay | Admitting: Internal Medicine

## 2020-10-08 VITALS — BP 123/92 | HR 72 | Temp 98.4°F | Ht 71.0 in | Wt 259.1 lb

## 2020-10-08 DIAGNOSIS — R1900 Intra-abdominal and pelvic swelling, mass and lump, unspecified site: Secondary | ICD-10-CM

## 2020-10-08 DIAGNOSIS — H409 Unspecified glaucoma: Secondary | ICD-10-CM | POA: Diagnosis not present

## 2020-10-08 DIAGNOSIS — I10 Essential (primary) hypertension: Secondary | ICD-10-CM

## 2020-10-08 DIAGNOSIS — B351 Tinea unguium: Secondary | ICD-10-CM

## 2020-10-08 DIAGNOSIS — N644 Mastodynia: Secondary | ICD-10-CM

## 2020-10-08 MED ORDER — LOSARTAN POTASSIUM 100 MG PO TABS
100.0000 mg | ORAL_TABLET | Freq: Every day | ORAL | 3 refills | Status: DC
Start: 2020-10-08 — End: 2021-12-27

## 2020-10-08 NOTE — Progress Notes (Signed)
   CC: New to Establish  HPI:  Mr.Jamaar Kotch is a 65 y.o. M/F, with a PMH noted below, who presents to the clinic to establish care with a PCP. To see the management of their acute and chronic conditions, please see the A&P note under the Encounters tab.   PMHx:  Retroperitoneal mass  FH:  HTN: Mother Neuro: Stroke mother Cancer: Prostate with Father and Brother DM: None   PSHx:  Appendectomy: 40 years ago   Vaccinations:  Childhood vaccinations Flu shot: None Covid: Pfizer 2 doses and booster 06/07/19, 06/28/19, 02/23/20  Medications:  Losartan 100 mg Daily Latanoprost Ophthalmic 0.005% 125 mcg/2.5 ml nightly Timolol maleate ophthalmic gel forming solution 0.5% daily  SH:  Recently moved to [place] with wife currently house hunting Work: Retired Investment banker, operational at [employer]  Tobacco: None currently, Quit 25 years ago, but was intermittently smoking during that time.  ETOH: 1-2 drinks per month  Drugs: None     Past Medical History:  Diagnosis Date   Glaucoma    Hypertension    Onychomycosis    Retroperitoneal mass    Review of Systems:   Review of Systems  Constitutional:  Negative for chills, fever, malaise/fatigue and weight loss.  Respiratory:  Negative for cough.   Cardiovascular:  Negative for chest pain, palpitations and orthopnea.  Gastrointestinal:  Negative for abdominal pain, constipation, diarrhea, nausea and vomiting.  Musculoskeletal:  Negative for back pain, myalgias and neck pain.  Neurological:  Negative for dizziness and headaches.    Physical Exam:  Vitals:   10/08/20 1326  BP: (!) 123/92  Pulse: 72  Temp: 98.4 F (36.9 C)  TempSrc: Oral  SpO2: 100%  Weight: 259 lb 1.6 oz (117.5 kg)  Height: 5\' 11"  (1.803 m)   Physical Exam Constitutional:      General: He is not in acute distress.    Appearance: He is well-developed. He is not ill-appearing, toxic-appearing or diaphoretic.  Cardiovascular:     Rate and Rhythm: Normal rate and  regular rhythm.     Pulses: Normal pulses.     Heart sounds: Normal heart sounds. No murmur heard.   No friction rub. No gallop.  Pulmonary:     Effort: Pulmonary effort is normal.     Breath sounds: Normal breath sounds. No wheezing or rales.  Abdominal:     General: Abdomen is flat. Bowel sounds are normal. There is no distension.     Tenderness: There is no abdominal tenderness.  Musculoskeletal:        General: No swelling.     Right lower leg: No edema.     Left lower leg: No edema.     Comments: Right and left nipples and chest symmetric. Mild tenderness to palpitation at the 1000 position of the R nipple with no mass or erythema noted. No discharge bilaterally.   Skin:    Comments: Bilateral feet with thickened, malformed and discolored toenail of the great big toe.   Neurological:     Mental Status: He is alert.     Assessment & Plan:   See Encounters Tab for problem based charting.  Patient discussed with Dr. 

## 2020-10-08 NOTE — Patient Instructions (Signed)
To Mr. Timothy Harvey,   It was a pleasure meeting you today. We discussed your blood pressure, toenail changes, nipple pain and ongoing workup at [facility].   For your blood pressure I will refill your blood pressure medication.  For your Toenail changes, I will place a referral to the foot doctor to further discuss treatment options.   For your nipple pain, we will continue to monitor that pain. Please come back in 6 weeks for reevaluation.   For your ongoing workup at [facility], please keep your next appointment.   It was a pleasure meeting you today! Dolan Amen, MD

## 2020-10-09 ENCOUNTER — Encounter: Payer: Self-pay | Admitting: Internal Medicine

## 2020-10-09 DIAGNOSIS — B351 Tinea unguium: Secondary | ICD-10-CM | POA: Insufficient documentation

## 2020-10-09 DIAGNOSIS — I10 Essential (primary) hypertension: Secondary | ICD-10-CM | POA: Insufficient documentation

## 2020-10-09 DIAGNOSIS — R1900 Intra-abdominal and pelvic swelling, mass and lump, unspecified site: Secondary | ICD-10-CM | POA: Insufficient documentation

## 2020-10-09 DIAGNOSIS — N62 Hypertrophy of breast: Secondary | ICD-10-CM | POA: Insufficient documentation

## 2020-10-09 DIAGNOSIS — N644 Mastodynia: Secondary | ICD-10-CM | POA: Insufficient documentation

## 2020-10-09 DIAGNOSIS — H409 Unspecified glaucoma: Secondary | ICD-10-CM | POA: Insufficient documentation

## 2020-10-09 NOTE — Assessment & Plan Note (Signed)
Patient being followed by Dr. 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 Patient has an appointment for a PET scan on 10/17/20 and follow up appointment.  - Continue to follow with [facility]

## 2020-10-09 NOTE — Assessment & Plan Note (Signed)
Patient presents to establish care with our clinic.  Vitals with BMI 10/08/2020  Height 5\' 11"   Weight 259 lbs 2 oz  BMI 36.15  Systolic 123  Diastolic 92  Pulse 72  With blood pressure well under control with losartan 100 mg daily. He denies side effects of medication today. In need of refills as he only has several days worth of medication left.  - Refill Losartan 100 mg daily

## 2020-10-09 NOTE — Assessment & Plan Note (Signed)
Patient presents to the clinic with 3 months of bilateral great toe nail thickening, disfiguration, and discoloration. He has tried several OTC antifungal creams and sprays with little relief of his symptoms.   A/P:  Patient presents to the clinic with bilateral great big toe changes. On examination findings are consistent with onychomycosis. Did discuss starting Terbinafine and monitoring, but patient would like to be seen by podiatry.  - Referral to podiatry.  - Re

## 2020-10-09 NOTE — Assessment & Plan Note (Signed)
Patient with complaints of R nipple pain over the past 3 weeks. He states that his pain is only present when pushing on the affected area. He denies warmth of the area, fevers, or other signs of infection. He has not tried anything to alleviate pain. He has not noted nipple discharge.   A/P:  Patient presents with nipple pain for close to a month in duration. Breast tissue, nipple, and areolas are symmetric bilaterally with no retraction of the nipple. No masses appreciate, but mild pain to pain to tenderness at the 1000 position. Discussed continuing to monitor at this time. Patient is additionally undergoing imaging later this month, and we can follow up on imaging if there are any abnormalities present.  - Continue to monitor

## 2020-10-09 NOTE — Assessment & Plan Note (Signed)
Was being followed by [facility] ophthalmology in [facility]. Currently on   Latanoprost Ophthalmic 0.005% 125 mcg/2.5 ml nightly Timolol maleate ophthalmic gel forming solution 0.5% daily  He will be going to the [place] in [place] for follow up. Did speak with patient if his insurance will cover our clinic as primary if he is already following with the [place]. He will enquire when he goes to [facility] at the end of this month.  - [facility] - Continue current regimen.

## 2020-10-18 ENCOUNTER — Ambulatory Visit: Payer: Medicare Other | Admitting: Podiatry

## 2020-10-25 ENCOUNTER — Other Ambulatory Visit: Payer: Self-pay

## 2020-10-25 ENCOUNTER — Ambulatory Visit (INDEPENDENT_AMBULATORY_CARE_PROVIDER_SITE_OTHER): Payer: Medicare Other | Admitting: Podiatry

## 2020-10-25 ENCOUNTER — Encounter: Payer: Self-pay | Admitting: Podiatry

## 2020-10-25 DIAGNOSIS — M79675 Pain in left toe(s): Secondary | ICD-10-CM

## 2020-10-25 DIAGNOSIS — M79674 Pain in right toe(s): Secondary | ICD-10-CM | POA: Diagnosis not present

## 2020-10-25 DIAGNOSIS — B351 Tinea unguium: Secondary | ICD-10-CM

## 2020-10-25 MED ORDER — TERBINAFINE HCL 250 MG PO TABS: 250.0000 mg | ORAL_TABLET | Freq: Every day | ORAL | 0 refills | Status: DC

## 2020-10-25 NOTE — Progress Notes (Signed)
  Subjective:  Patient ID: Timothy Harvey, male    DOB: Apr 17, 1956,  MRN: [id number]  Chief Complaint  Patient presents with   Nail Problem    np - nail fungus    65 y.o. male presents with the above complaint. History confirmed with patient.   Objective:  Physical Exam: warm, good capillary refill, no trophic changes or ulcerative lesions, normal DP and PT pulses, and normal sensory exam.  Onychomycosis of the bilateral hallux nail plate     Assessment:   1. Onychomycosis   2. Pain due to onychomycosis of toenails of both feet      Plan:  Patient was evaluated and treated and all questions answered.  Discussed the etiology and treatment options for the condition in detail with the patient. Educated patient on the topical and oral treatment options for mycotic nails. Recommended debridement of the nails today. Sharp and mechanical debridement performed of all painful and mycotic nails today. Nails debrided in length and thickness using a nail nipper to level of comfort. Discussed treatment options including appropriate shoe gear. Follow up as needed for painful nails.  Rx for Lamisil sent to pharmacy    No follow-ups on file.

## 2020-11-07 MED ORDER — VITAMIN D3 125 MCG (5000 UT) PO TABS
1 refills | Status: CP
Start: 2020-11-07 — End: ?

## 2020-11-19 ENCOUNTER — Other Ambulatory Visit: Payer: Self-pay | Admitting: Internal Medicine

## 2020-11-19 ENCOUNTER — Ambulatory Visit (INDEPENDENT_AMBULATORY_CARE_PROVIDER_SITE_OTHER): Payer: Medicare Other | Admitting: Internal Medicine

## 2020-11-19 ENCOUNTER — Other Ambulatory Visit: Payer: Self-pay

## 2020-11-19 ENCOUNTER — Encounter: Payer: Self-pay | Admitting: Internal Medicine

## 2020-11-19 VITALS — BP 135/93 | HR 97 | Temp 98.9°F | Ht 71.0 in | Wt 263.0 lb

## 2020-11-19 DIAGNOSIS — N644 Mastodynia: Secondary | ICD-10-CM | POA: Diagnosis not present

## 2020-11-19 DIAGNOSIS — E785 Hyperlipidemia, unspecified: Secondary | ICD-10-CM | POA: Diagnosis present

## 2020-11-19 DIAGNOSIS — R1900 Intra-abdominal and pelvic swelling, mass and lump, unspecified site: Secondary | ICD-10-CM

## 2020-11-19 DIAGNOSIS — E041 Nontoxic single thyroid nodule: Secondary | ICD-10-CM | POA: Insufficient documentation

## 2020-11-19 MED ORDER — ROSUVASTATIN CALCIUM 10 MG PO TABS: 10.0000 mg | ORAL_TABLET | Freq: Every day | ORAL | 11 refills | Status: DC

## 2020-11-19 MED ORDER — SIMVASTATIN 20 MG PO TABS: 20.0000 mg | ORAL_TABLET | Freq: Every evening | ORAL | 11 refills | Status: DC

## 2020-11-19 MED ORDER — DICLOFENAC SODIUM 1 % EX GEL: 2.0000 g | Freq: Two times a day (BID) | CUTANEOUS | Status: DC | PRN

## 2020-11-19 NOTE — Assessment & Plan Note (Signed)
Continues to follow with Dr. Tracey Harries (surgical oncologist) at [facility].  He recently underwent a PET scan that showed that the retroperitoneal mass did not demonstrate metabolic activity as well as a 2 cm Left thyroid nodule with recommendations for U/S.  Their plans at this time are to repeat imaging in 3 months. -continue following with [facility]

## 2020-11-19 NOTE — Addendum Note (Signed)
Addended by: Lucille Passy on: 11/19/2020 03:02 PM   Modules accepted: Orders

## 2020-11-19 NOTE — Patient Instructions (Addendum)
Timothy Harvey, it was a pleasure seeing you today!  Today we discussed: Pain at nipple: I sent in some Voltaren gel to hopefully help with this.  If you do not have any relief in your pain in 1-2 weeks, please call us back and our next step will be some imaging.  High cholesterol: We are starting a medication to lower your cholesterol today.  I would like to have you follow back up in 3 months to repeat labs for cholesterol.  Thyroid: We are getting an Ultrasound of your thyroid to follow-up from an incidental finding on your PET scan 06/22.  I have ordered the following labs today:  Lab Orders  No laboratory test(s) ordered today     Tests ordered today:  none  Referrals ordered today:   Referral Orders  No referral(s) requested today     I have ordered the following medication/changed the following medications:   Stop the following medications: There are no discontinued medications.   Start the following medications: Meds ordered this encounter  Medications   rosuvastatin (CRESTOR) 10 MG tablet    Sig: Take 1 tablet (10 mg total) by mouth daily.    Dispense:  30 tablet    Refill:  11     Follow-up: 2-3 months with PCP   Please make sure to arrive 15 minutes prior to your next appointment. If you arrive late, you may be asked to reschedule.   We look forward to seeing you next time. Please call our clinic at [phone] if you have any questions or concerns. The best time to call is Monday-Friday from 9am-4pm, but there is someone available 24/7. If after hours or the weekend, call the main hospital number and ask for the Internal Medicine Resident On-Call. If you need medication refills, please notify your pharmacy one week in advance and they will send [place] a request.  Thank you for letting us take part in your care. Wishing you the best!  Thank you, Dr. Sloan Leiter

## 2020-11-19 NOTE — Assessment & Plan Note (Addendum)
Incidental thyroid nodule found on PET scan 09/2020.  2 cm FDG avid left thyroid nodule with recommendation of further evaluation with thyroid ultrasound. -ordered thyroid ultrasound, will follow  ADDENDUM: FNA for nodule in left mid thyroid per radiology. 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  Called patient and let him know results.  Referral to ENT.

## 2020-11-19 NOTE — Assessment & Plan Note (Signed)
Patients present with 6 weeks of nipple pain, he was seen in [facility] on 06/22 for this problem as well. Since then the pain has not improved.  He states that he has sharp pain at baseline 2-3 and with palpation the pain increases to 7-8 out of 10.  He denies warmth of the area or other signs of inflammation, fevers, discharge, or blood.   PLAN: -Presenting with 6 weeks of nipple pain that has increased in pain over time.  Breast tissue, nipple, and areolas are symmetric bilaterally with no retraction of the nipple.  No masses appreciated, but he expresses that he has more pain in the central portion at his nipple.   -Mammogram ordered -Also ordered Voltaren gel to help with pain.

## 2020-11-19 NOTE — Progress Notes (Signed)
   CC: follow-up for nipple pain  HPI:  [name] is a 65 y.o. with medical history retroperitoneal mass (following with [facility]), HTN, HLD, and Glaucoma presenting to [facility] for follow-up for nipple pain.  Please see problem-based list for further details, assessments, and plans.  Past Medical History:  Diagnosis Date   Glaucoma    Hypertension    Onychomycosis    Retroperitoneal mass    Review of Systems:  As per HPI  Physical Exam:  Vitals:   11/19/20 1322 11/19/20 1324  BP:  (!) 135/93  Pulse:  97  Temp:  98.9 F (37.2 C)  TempSrc:  Oral  SpO2:  99%  Weight: 263 lb (119.3 kg)   Height: 5\' 11"  (1.803 m)     General: well-developed, obese man HENT: NCAT, no scars noted Eyes: no scleral icterus, conjunctiva clear CV: no murmurs appreciated, normal rate Pulm: CTAB, normal pulmonary effort GI: no tenderness present, bowel sounds present Breast: breast tissue, nipple, and areolas symmetric bilaterally with no retraction of the nipple, no discharge or blood seen.  No masses appreciated, tenderness noted over nipple.  No erythema noted of right breast MSK: no pain in breast with movement of arms, no tenderness noted elsewhere on chest Psych:Normal mood and affect  Assessment & Plan:   See Encounters Tab for problem based charting.  Patient seen with Dr. 

## 2020-11-19 NOTE — Assessment & Plan Note (Addendum)
Medications: Patient states that he has been on statins in the past with some muscle cramps, but that he would be willing to try them again Last Cholesterol:  07/2020  Total= 224 LDL=156  ASCVD risk=7.5%  A/P: -will start patient on Simvastatin 20 mg.  Patient has a history of some difficulty with statins, instructed patient to call clinic if he cannot tolerate medication.

## 2020-11-20 ENCOUNTER — Ambulatory Visit
Admission: RE | Admit: 2020-11-20 | Discharge: 2020-11-20 | Disposition: A | Discharge status: Home / Self Care | Payer: Medicare Other | Source: Ambulatory Visit | Attending: Internal Medicine | Admitting: Internal Medicine

## 2020-11-20 ENCOUNTER — Other Ambulatory Visit: Payer: Self-pay

## 2020-11-20 DIAGNOSIS — E041 Nontoxic single thyroid nodule: Secondary | ICD-10-CM

## 2020-11-21 NOTE — Addendum Note (Signed)
Addended by: Lucille Passy on: 11/21/2020 05:15 PM   Modules accepted: Orders

## 2020-11-21 NOTE — Addendum Note (Signed)
Addended by: Lucille Passy on: 11/21/2020 02:53 PM   Modules accepted: Orders

## 2020-11-22 NOTE — Addendum Note (Signed)
Addended by: Earl Lagos on: 11/22/2020 02:21 PM   Modules accepted: Level of Service

## 2020-11-22 NOTE — Progress Notes (Signed)
Internal Medicine Clinic Attending  I saw and evaluated the patient.  I personally confirmed the key portions of the history and exam documented by Dr. Masters and I reviewed pertinent patient test results.  The assessment, diagnosis, and plan were formulated together and I agree with the documentation in the resident's note.  

## 2020-11-29 NOTE — Progress Notes (Signed)
Patient called.  Patient aware. He states that he does have an appointment set up for FNA o fthe thyroid nodule.

## 2020-12-06 ENCOUNTER — Other Ambulatory Visit (HOSPITAL_COMMUNITY)
Admission: RE | Admit: 2020-12-06 | Discharge: 2020-12-06 | Disposition: A | Discharge status: Home / Self Care | Payer: Medicare Other | Source: Ambulatory Visit | Attending: Internal Medicine | Admitting: Internal Medicine

## 2020-12-06 ENCOUNTER — Ambulatory Visit
Admission: RE | Admit: 2020-12-06 | Discharge: 2020-12-06 | Disposition: A | Discharge status: Home / Self Care | Payer: Medicare Other | Source: Ambulatory Visit | Attending: Internal Medicine | Admitting: Internal Medicine

## 2020-12-06 DIAGNOSIS — E041 Nontoxic single thyroid nodule: Secondary | ICD-10-CM

## 2020-12-07 LAB — CYTOLOGY - NON PAP

## 2020-12-10 NOTE — Addendum Note (Signed)
Addended by: Lucille Passy on: 12/10/2020 01:30 PM   Modules accepted: Orders

## 2020-12-20 ENCOUNTER — Ambulatory Visit
Admission: RE | Admit: 2020-12-20 | Discharge: 2020-12-20 | Disposition: A | Discharge status: Home / Self Care | Payer: Medicare Other | Source: Ambulatory Visit | Attending: Internal Medicine | Admitting: Internal Medicine

## 2020-12-20 ENCOUNTER — Other Ambulatory Visit: Payer: Self-pay

## 2020-12-20 ENCOUNTER — Other Ambulatory Visit: Payer: Self-pay | Admitting: Internal Medicine

## 2020-12-20 DIAGNOSIS — N644 Mastodynia: Secondary | ICD-10-CM

## 2021-01-03 ENCOUNTER — Other Ambulatory Visit: Payer: Self-pay | Admitting: Internal Medicine

## 2021-01-03 ENCOUNTER — Ambulatory Visit
Admission: RE | Admit: 2021-01-03 | Discharge: 2021-01-03 | Disposition: A | Discharge status: Home / Self Care | Payer: Medicare Other | Source: Ambulatory Visit | Attending: Internal Medicine | Admitting: Internal Medicine

## 2021-01-03 ENCOUNTER — Encounter (HOSPITAL_COMMUNITY): Payer: Self-pay

## 2021-01-03 ENCOUNTER — Other Ambulatory Visit: Payer: Self-pay

## 2021-01-03 DIAGNOSIS — N644 Mastodynia: Secondary | ICD-10-CM

## 2021-01-09 ENCOUNTER — Other Ambulatory Visit: Payer: Self-pay | Admitting: Diagnostic Radiology

## 2021-01-09 DIAGNOSIS — R921 Mammographic calcification found on diagnostic imaging of breast: Secondary | ICD-10-CM

## 2021-01-09 NOTE — Progress Notes (Signed)
Called and reviewed thyroid biopsy results with patient.

## 2021-01-15 ENCOUNTER — Other Ambulatory Visit: Payer: Self-pay

## 2021-01-15 ENCOUNTER — Telehealth: Payer: Self-pay | Admitting: Podiatry

## 2021-01-15 DIAGNOSIS — B351 Tinea unguium: Secondary | ICD-10-CM

## 2021-01-15 LAB — HEPATIC FUNCTION PANEL
AG Ratio: 1.5 (calc) (ref 1.0–2.5)
ALT: 24 U/L (ref 9–46)
AST: 21 U/L (ref 10–35)
Albumin: 4 g/dL (ref 3.6–5.1)
Alkaline phosphatase (APISO): 68 U/L (ref 35–144)
Bilirubin, Direct: 0.1 mg/dL (ref 0.0–0.2)
Globulin: 2.7 g/dL (calc) (ref 1.9–3.7)
Indirect Bilirubin: 0.2 mg/dL (calc) (ref 0.2–1.2)
Total Bilirubin: 0.3 mg/dL (ref 0.2–1.2)
Total Protein: 6.7 g/dL (ref 6.1–8.1)

## 2021-01-15 NOTE — Telephone Encounter (Signed)
I called the patient and told him he would need to do a liver function panel , that he can come up to the office and pick that paperwork up.

## 2021-01-15 NOTE — Telephone Encounter (Signed)
Patient  called the the office wanting a refill of Terbinafine 250mg 

## 2021-01-15 NOTE — Telephone Encounter (Signed)
Needs an updated liver function panel prior to refill. He can just come in and pick up the paperwork to take to [facility] or [facility], no appt necessary. - Dr. Logan Bores

## 2021-01-17 ENCOUNTER — Other Ambulatory Visit: Payer: Self-pay | Admitting: Internal Medicine

## 2021-01-17 DIAGNOSIS — R921 Mammographic calcification found on diagnostic imaging of breast: Secondary | ICD-10-CM

## 2021-01-22 ENCOUNTER — Other Ambulatory Visit: Payer: Self-pay

## 2021-01-22 ENCOUNTER — Ambulatory Visit
Admission: RE | Admit: 2021-01-22 | Discharge: 2021-01-22 | Disposition: A | Discharge status: Home / Self Care | Payer: Medicare Other | Source: Ambulatory Visit | Attending: Internal Medicine | Admitting: Internal Medicine

## 2021-01-22 ENCOUNTER — Ambulatory Visit
Admission: RE | Admit: 2021-01-22 | Discharge: 2021-01-22 | Disposition: A | Discharge status: Home / Self Care | Payer: Medicare Other | Source: Ambulatory Visit | Attending: Diagnostic Radiology | Admitting: Diagnostic Radiology

## 2021-01-22 DIAGNOSIS — R921 Mammographic calcification found on diagnostic imaging of breast: Secondary | ICD-10-CM

## 2021-01-23 ENCOUNTER — Telehealth: Payer: Self-pay | Admitting: Podiatry

## 2021-01-23 MED ORDER — TERBINAFINE HCL 250 MG PO TABS: 250.0000 mg | ORAL_TABLET | Freq: Every day | ORAL | 0 refills | Status: DC

## 2021-01-23 NOTE — Telephone Encounter (Signed)
I called the office to let him know that his RX has been sent

## 2021-01-23 NOTE — Telephone Encounter (Signed)
Patient called the office wanting a refill of his Lamisil

## 2021-01-25 ENCOUNTER — Other Ambulatory Visit: Payer: Self-pay | Admitting: Internal Medicine

## 2021-01-25 DIAGNOSIS — Z09 Encounter for follow-up examination after completed treatment for conditions other than malignant neoplasm: Secondary | ICD-10-CM

## 2021-02-26 ENCOUNTER — Ambulatory Visit (INDEPENDENT_AMBULATORY_CARE_PROVIDER_SITE_OTHER): Payer: Medicare Other | Admitting: Podiatry

## 2021-02-26 ENCOUNTER — Other Ambulatory Visit: Payer: Self-pay

## 2021-02-26 DIAGNOSIS — B351 Tinea unguium: Secondary | ICD-10-CM | POA: Diagnosis not present

## 2021-02-26 NOTE — Progress Notes (Signed)
  Subjective:  Patient ID: Timothy Harvey, male    DOB: Apr 19, 1956,  MRN: [id number]  Chief Complaint  Patient presents with   Nail Problem    Thick painful toenails, 4 month follow up    65 y.o. male presents with the above complaint. History confirmed with patient.   Objective:  Physical Exam: warm, good capillary refill, no trophic changes or ulcerative lesions, normal DP and PT pulses, and normal sensory exam.  Onychomycosis of the bilateral hallux nail plate, he has some proximal clearing        Assessment:   1. Onychomycosis       Plan:  Patient was evaluated and treated and all questions answered.  Overall is had some improvement I debrided the nails in length and thickness that using a sharp nail nipper.  I recommend he continue on the Lamisil.  He is currently on his first refill, he will finish this and then be off of it about 2 weeks and then I will refill 1 more time.  If not improving by next visit I think we may want to consider temporary removal of the entire nail plate and allow it to regrow while he is on Lamisil.  He will follow with me in 4 months.    Return in about 4 months (around 06/26/2021) for follow up after nail fungus treatment.

## 2021-04-05 ENCOUNTER — Telehealth: Payer: Self-pay | Admitting: Podiatry

## 2021-04-05 NOTE — Telephone Encounter (Signed)
Medication : terbinafine (LAMISIL) 250 MG tablet Pharmacy  [facility] on [address]    Patient called in requesting to have refill sent in

## 2021-04-08 MED ORDER — TERBINAFINE HCL 250 MG PO TABS: 250.0000 mg | ORAL_TABLET | Freq: Every day | ORAL | 0 refills | Status: DC

## 2021-04-30 ENCOUNTER — Other Ambulatory Visit: Payer: Self-pay | Admitting: Internal Medicine

## 2021-04-30 DIAGNOSIS — R921 Mammographic calcification found on diagnostic imaging of breast: Secondary | ICD-10-CM

## 2021-05-30 ENCOUNTER — Ambulatory Visit
Admission: RE | Admit: 2021-05-30 | Discharge: 2021-05-30 | Disposition: A | Discharge status: Home / Self Care | Payer: Medicare Other | Source: Ambulatory Visit | Attending: Internal Medicine | Admitting: Internal Medicine

## 2021-05-30 ENCOUNTER — Other Ambulatory Visit: Payer: Self-pay

## 2021-05-30 DIAGNOSIS — R921 Mammographic calcification found on diagnostic imaging of breast: Secondary | ICD-10-CM

## 2021-06-27 ENCOUNTER — Other Ambulatory Visit: Payer: Self-pay

## 2021-06-27 ENCOUNTER — Ambulatory Visit (INDEPENDENT_AMBULATORY_CARE_PROVIDER_SITE_OTHER): Payer: Medicare Other | Admitting: Podiatry

## 2021-06-27 DIAGNOSIS — B351 Tinea unguium: Secondary | ICD-10-CM

## 2021-06-27 MED ORDER — TERBINAFINE HCL 250 MG PO TABS: 250.0000 mg | ORAL_TABLET | Freq: Every day | ORAL | 0 refills | Status: DC

## 2021-06-27 NOTE — Progress Notes (Signed)
?  Subjective:  ?Patient ID: Timothy Harvey, male    DOB: 1955/07/22,  MRN: [id number] ? ?Chief Complaint  ?Patient presents with  ? Nail Problem  ?    follow up after nail fungus treatment.  ? ? ?66 y.o. male presents with the above complaint. History confirmed with patient.  ? ?Objective:  ?Physical Exam: ?warm, good capillary refill, no trophic changes or ulcerative lesions, normal DP and PT pulses, and normal sensory exam.  Onychomycosis of the bilateral hallux nail plate, he has over 50% proximal clearing ? ? ? ? ? ? ?Assessment:  ? ?1. Onychomycosis   ? ? ? ? ? ?Plan:  ?Patient was evaluated and treated and all questions answered. ? ?Doing very well he is turned a corner and has had nearly 50% resolution of the onychomycosis.  I recommend we continue using Lamisil until completely eradicated.  I sent in another 90-day course for this and he will follow-up with me in 4 months. ? ? ?Return in about 4 months (around 10/27/2021) for follow up after nail fungus treatment.  ? ? ?

## 2021-09-17 ENCOUNTER — Other Ambulatory Visit: Payer: Self-pay

## 2021-09-17 ENCOUNTER — Ambulatory Visit (INDEPENDENT_AMBULATORY_CARE_PROVIDER_SITE_OTHER): Payer: Medicare Other | Admitting: Internal Medicine

## 2021-09-17 ENCOUNTER — Encounter: Payer: Self-pay | Admitting: Internal Medicine

## 2021-09-17 VITALS — BP 136/94 | HR 93 | Temp 97.9°F | Ht 71.0 in | Wt 268.0 lb

## 2021-09-17 DIAGNOSIS — R1900 Intra-abdominal and pelvic swelling, mass and lump, unspecified site: Secondary | ICD-10-CM | POA: Diagnosis not present

## 2021-09-17 DIAGNOSIS — G44209 Tension-type headache, unspecified, not intractable: Secondary | ICD-10-CM

## 2021-09-17 DIAGNOSIS — I10 Essential (primary) hypertension: Secondary | ICD-10-CM

## 2021-09-17 DIAGNOSIS — E669 Obesity, unspecified: Secondary | ICD-10-CM | POA: Diagnosis present

## 2021-09-17 DIAGNOSIS — E785 Hyperlipidemia, unspecified: Secondary | ICD-10-CM

## 2021-09-17 DIAGNOSIS — Z87891 Personal history of nicotine dependence: Secondary | ICD-10-CM | POA: Diagnosis not present

## 2021-09-17 DIAGNOSIS — E041 Nontoxic single thyroid nodule: Secondary | ICD-10-CM | POA: Diagnosis not present

## 2021-09-17 DIAGNOSIS — Z9189 Other specified personal risk factors, not elsewhere classified: Secondary | ICD-10-CM | POA: Diagnosis not present

## 2021-09-17 DIAGNOSIS — R7989 Other specified abnormal findings of blood chemistry: Secondary | ICD-10-CM | POA: Diagnosis not present

## 2021-09-17 DIAGNOSIS — Z6837 Body mass index (BMI) 37.0-37.9, adult: Secondary | ICD-10-CM | POA: Diagnosis not present

## 2021-09-17 DIAGNOSIS — Z0000 Encounter for general adult medical examination without abnormal findings: Secondary | ICD-10-CM

## 2021-09-17 NOTE — Progress Notes (Signed)
  CC: routine follow up, 2 weeks of headaches  HPI:  Timothy Harvey is a 66 y.o. male with a past medical history stated below.   No problem-specific Assessment & Plan notes found for this encounter.    Past Medical History:  Diagnosis Date   Glaucoma    Hypertension    Onychomycosis    Retroperitoneal mass     Current Outpatient Medications on File Prior to Visit  Medication Sig Dispense Refill   latanoprost (XALATAN) 0.005 % ophthalmic solution INSTILL 1 DROP IN EACH EYE AT BEDTIME FOR GLAUCOMA     Garlic 1000 MG CAPS Take by mouth.     hydrochlorothiazide (HYDRODIURIL) 25 MG tablet Take 25 mg by mouth daily.     losartan (COZAAR) 100 MG tablet Take 1 tablet (100 mg total) by mouth daily. 90 tablet 3   Multiple Vitamins-Minerals (MENS MULTIVITAMIN PO) Take by mouth.     Omega-3 Fatty Acids (FISH OIL) 1000 MG CAPS Take by mouth.     simvastatin (ZOCOR) 20 MG tablet Take 1 tablet (20 mg total) by mouth every evening. 30 tablet 11   terbinafine (LAMISIL) 250 MG tablet Take 1 tablet (250 mg total) by mouth daily. 90 tablet 0   timolol (TIMOPTIC) 0.5 % ophthalmic solution SMARTSIG:In Eye(s)     No current facility-administered medications on file prior to visit.    Family History  Problem Relation Age of Onset   Hypertension Mother    Stroke Mother    Prostate cancer Father    Prostate cancer Brother     Review of Systems: ROS negative except for what is noted on the assessment and plan.  Vitals:   09/17/21 1032  BP: (!) 136/94  Pulse: 93  Temp: 97.9 F (36.6 C)  TempSrc: Oral  SpO2: 98%  Weight: 268 lb (121.6 kg)  Height: 5\' 11"  (1.803 m)     Physical Exam: General: Well appearing, obese african [place] male, NAD HENT: normocephalic, atraumatic, external ears and nares appear unremarkable EYES: conjunctiva non-erythematous, no scleral icterus CV: regular rate, normal rhythm, no murmurs, rubs, gallops. No LEE. Pulmonary: normal work of breathing on RA,  lungs clear to auscultation, no rales, wheezes, rhonchi Abdominal: non-distended, soft, non-tender to palpation, normal BS Skin: Warm and dry, no rashes or lesions on exposed surfaces Neurological: MS: awake, alert and oriented x3, normal speech and fund of knowledge CNs: PERRL, EOMi, face sensation symmetric, face appears symmetric, tongue midline, uvula elevates symmetrically, shoulder shrug strength symmetric Motor: BUE 5/5, BLE 5/5 Coordination: RAM intact, FTN intact, HTS intact Psych: normal affect     See Encounters Tab for problem based charting.  Patient discussed with Dr. Rebbeca Paul, M.D. [facility] Internal Medicine, PGY-1 Pager: [phone] Date 09/17/2021 Time 2:17 PM

## 2021-09-17 NOTE — Patient Instructions (Signed)
Thank you, Mr.Oaklyn B Woodstock for allowing [place] to provide your care today. Today we discussed:  Headaches:  -Take tylenol 1,000mg  up to three times a day -We will put in a referral for a sleep study -We will give the hydrochlorothiazide more time to work to full effect. Please take both Losartan and HCTZ and continue to check your blood pressure once a day at around the dame time each day.  I have ordered the following labs for you:  Lab Orders         Lipid Profile       My Chart Access: [email or web address]  Please follow-up in 1 month for blood pressure check.  Please make sure to arrive 15 minutes prior to your next appointment. If you arrive late, you may be asked to reschedule.    We look forward to seeing you next time. Please call our clinic at [phone] if you have any questions or concerns. The best time to call is Monday-Friday from 9am-4pm, but there is someone available 24/7. If after hours or the weekend, call the main hospital number and ask for the Internal Medicine Resident On-Call. If you need medication refills, please notify your pharmacy one week in advance and they will send [place] a request.   Thank you for letting us take part in your care. Wishing you the best!  Ellison Carwin, MD 09/17/2021, 11:44 AM IM Resident, PGY-1

## 2021-09-18 DIAGNOSIS — Z0000 Encounter for general adult medical examination without abnormal findings: Secondary | ICD-10-CM | POA: Insufficient documentation

## 2021-09-18 DIAGNOSIS — R7989 Other specified abnormal findings of blood chemistry: Secondary | ICD-10-CM | POA: Insufficient documentation

## 2021-09-18 DIAGNOSIS — G44209 Tension-type headache, unspecified, not intractable: Secondary | ICD-10-CM | POA: Insufficient documentation

## 2021-09-18 NOTE — Assessment & Plan Note (Signed)
HCV Ab testing completed 5/10 and non reactive per lab reports from [facility] to [facility]. TSH was also checked and normal. Will have lab sheet scanned into our system.

## 2021-09-18 NOTE — Assessment & Plan Note (Addendum)
Patient last saw Dr. Donnal Moat April 2023. Biospy showed both solid and cystic characteristics. Etiology of the mass is not totally clear. The consensus was this is likely benign. They will continue loose surveillance. Patient has a surveillance PET scan on June 5th.

## 2021-09-18 NOTE — Assessment & Plan Note (Signed)
Patient's blood pressure 136/94 today. On review of the documentation patient brings with him today, he had seen an NP at [facility] earlier in May. HCTZ 25mg  daily was added to his regimen of Losartan 100mg  daily. He had just started the HCTZ on Saturday, three days ago and has been tracking his blood pressures since (SBP 130s-160s). His pressures have not significantly improved over the last three days. BMP was completed on 5/10 at outside clinic and was unremarkable.   On assessment, blood pressures elevated over the weekend but fairly well controlled today in clinic. He was recently started on HCTZ 25mg  daily and has only taken this for total three days. Will continue current regimen and recheck in one month to determine full effect of adding HCTZ.  Plan: -Continue Losartan 100mg  daily -Continue HCTZ 25mg  daily -Continue to check Bps at home and records pressures -Follow up in one month for BP check and repeat BMP

## 2021-09-18 NOTE — Assessment & Plan Note (Addendum)
Patient's vitamin D was tested 09/04/21 at [facility]. Vitamin D, 25-OH was 27 (30-100) mildly low, not quite meeting criteria for deficiency. Patient was encouraged to go outside more often to naturally produce more vitamin D. Serum calcium 5/10 was 9.0 (normal), PTH was 99 (16-77) mildly elevated.   Patient's vitamin D levels mildly low, calcium normal and PTH mildly elevated likely secondary to low vit d to maintain serum calcium levels. Plan: -Continue conservative management -Could recheck vit d next OV

## 2021-09-18 NOTE — Assessment & Plan Note (Signed)
Patient endorses adherence to simvastatin 20mg , no side effects reported. Lipid profile was obtained at [facility] with [facility]. Total cholesterol 159, HDL 44, Triglycerides 123, LDL 93. ASCVD risk of cardiovascular event in 10 years is 18.3%. Mod-high intensity statin recommended. Plan: -Continue simvastatin 20mg  daily, could consider increasing to high intensity dosing

## 2021-09-18 NOTE — Assessment & Plan Note (Addendum)
Patient reports two weeks of headaches that came on gradually. Headache started near the forehead bilateral sides. Feels like a pounding, throbbing pain that has persisted everyday for the last two weeks, pain 4-8/10. No nausea, vomiting, photophobia, phonophobia. He has been less active with his usual hobbies since the headaches have started. He thinks these may be secondary to his high blood pressure since he has had HA 2/2 high BP before. He denies focal neurologic deficits aside from intermittent choking on liquids for past one week, no vision changes, no focal weakness. Neurology exam today normal. Patient does note he has worse headaches in the mornings. HA intermittently worse with coughing and straining. He does snore and underwent sleep study ~15 years ago which did not show OSA. Patient's STOP Bang score 5 today, high risk for OSA. Patient was taking Advil for his headaches but was informed tylenol is a better option and has been taking tylenol 1000mg  every 4-6 hours.   On assessment, patient has tension type headaches that may be secondary to medication overuse HA, or OSA. Headache may also be secondary to elevated BP since this feels similar to HA patient had when his BP was elevated. Patient does have a new HA at age 66, no clear red flag symptoms besides this. Plan: -Counselled on rebound HA from NSAIDs, tylenol, recommend using tylenol less than 15 days per month -Recently added HCTZ to Losartan for BP control, will continue to follow up on this and optimize HTN regimen. -Referral to sleep center for sleep study to evaluate for OSA -If HA continues/worsens, dysphagia continues/worsens despite treating HTN and OSA, would recommend brain MRI

## 2021-09-18 NOTE — Assessment & Plan Note (Signed)
Patient reports he established with ENT beginning of this year and they are monitoring the nodule, will have repeat imaging in the next few months. Unfortunately we are unable to see records of ENT appointments in the chart.

## 2021-09-19 NOTE — Progress Notes (Signed)
Internal Medicine Clinic Attending ? ?Case discussed with Dr. Zinoviev  At the time of the visit.  We reviewed the resident?s history and exam and pertinent patient test results.  I agree with the assessment, diagnosis, and plan of care documented in the resident?s note.  ?

## 2021-10-05 ENCOUNTER — Other Ambulatory Visit: Payer: Self-pay | Admitting: Internal Medicine

## 2021-10-05 DIAGNOSIS — E785 Hyperlipidemia, unspecified: Secondary | ICD-10-CM

## 2021-10-11 ENCOUNTER — Other Ambulatory Visit: Payer: Self-pay | Admitting: Podiatry

## 2021-10-19 ENCOUNTER — Encounter: Payer: Self-pay | Admitting: *Deleted

## 2021-12-04 ENCOUNTER — Encounter: Payer: Self-pay | Admitting: Student

## 2021-12-04 ENCOUNTER — Ambulatory Visit (INDEPENDENT_AMBULATORY_CARE_PROVIDER_SITE_OTHER): Payer: Medicare Other | Admitting: Internal Medicine

## 2021-12-04 VITALS — BP 114/78 | HR 87 | Temp 97.9°F | Wt 269.9 lb

## 2021-12-04 DIAGNOSIS — N644 Mastodynia: Secondary | ICD-10-CM

## 2021-12-04 NOTE — Patient Instructions (Signed)
Thank you, Timothy Harvey for allowing [place] to provide your care today. Today we discussed:  Nipple/breast pain: We are going to get a diagnostic mammogram of your right breast. Your last one was normal and did not show any signs of cancer, but since your pain has returned, we are going to get an updated one  I have ordered the following labs for you:  Lab Orders  No laboratory test(s) ordered today     Tests ordered today:  mammogram  Referrals ordered today:   Referral Orders  No referral(s) requested today     I have ordered the following medication/changed the following medications:   Stop the following medications: There are no discontinued medications.   Start the following medications: No orders of the defined types were placed in this encounter.    Follow up:  as needed, if pain worsens     Should you have any questions or concerns please call the internal medicine clinic at [phone].     Elza Rafter, D.O. [facility]

## 2021-12-04 NOTE — Assessment & Plan Note (Signed)
Patient presents to the [facility] with 1 week of right-sided nipple pain.  He was seen in our clinic for this in July 2022 and underwent diagnostic mammography. In September 2022 the patient describes having a " scraping" procedure done and a biopsy, which confirmed calcifications in his right breast.  His pain resolved after these 2 procedures and he was not diagnosed with cancer.  He was advised to have a diagnostic mammogram 6 months after the procedure, which he completed in February 2022, and this simply showed benign right breast calcifications and no evidence of malignancy.  Patient states that his pain returned about 1 week ago and feels the exact same as it did 1 year ago.  He denies any palpable masses/lumps or nipple discharge.  He does not have any nipple pain at rest but only has nipple pain upon palpation.  On exam there are no masses or lumps palpated in either breast.  He has no nipple discharge or nipple inversion/retraction.   Plan: - Diagnostic mammogram ordered today

## 2021-12-04 NOTE — Progress Notes (Signed)
CC: breast pain  HPI:  Timothy Harvey is a 66 y.o. male living with a history stated below and presents today for breast pain. Please see problem based assessment and plan for additional details.  Past Medical History:  Diagnosis Date   Glaucoma    Hypertension    Onychomycosis    Retroperitoneal mass     Current Outpatient Medications on File Prior to Visit  Medication Sig Dispense Refill   Garlic 1000 MG CAPS Take by mouth.     hydrochlorothiazide (HYDRODIURIL) 25 MG tablet Take 25 mg by mouth daily.     latanoprost (XALATAN) 0.005 % ophthalmic solution INSTILL 1 DROP IN EACH EYE AT BEDTIME FOR GLAUCOMA     losartan (COZAAR) 100 MG tablet Take 1 tablet (100 mg total) by mouth daily. 90 tablet 3   Multiple Vitamins-Minerals (MENS MULTIVITAMIN PO) Take by mouth.     Omega-3 Fatty Acids (FISH OIL) 1000 MG CAPS Take by mouth.     simvastatin (ZOCOR) 20 MG tablet TAKE 1 TABLET BY MOUTH EVERY DAY IN THE EVENING 90 tablet 3   terbinafine (LAMISIL) 250 MG tablet TAKE 1 TABLET BY MOUTH EVERY DAY 90 tablet 0   timolol (TIMOPTIC) 0.5 % ophthalmic solution SMARTSIG:In Eye(s)     No current facility-administered medications on file prior to visit.    Family History  Problem Relation Age of Onset   Hypertension Mother    Stroke Mother    Prostate cancer Father    Prostate cancer Brother     Social History   Socioeconomic History   Marital status: Married    Spouse name: Not on file   Number of children: Not on file   Years of education: Not on file   Highest education level: Not on file  Occupational History   Not on file  Tobacco Use   Smoking status: Former    Packs/day: 0.50    Types: Cigarettes    Quit date: 1997    Years since quitting: 26.6   Smokeless tobacco: Never  Substance and Sexual Activity   Alcohol use: Not on file    Comment: 1-2 ETOH a month   Drug use: Never   Sexual activity: Not on file  Other Topics Concern   Not on file  Social History  Narrative   Not on file   Social Determinants of Health   Financial Resource Strain: Not on file  Food Insecurity: Not on file  Transportation Needs: Not on file  Physical Activity: Not on file  Stress: Not on file  Social Connections: Not on file  Intimate Partner Violence: Not on file    Review of Systems: ROS negative except for what is noted on the assessment and plan.  Vitals:   12/04/21 1511  BP: 114/78  Pulse: 87  Temp: 97.9 F (36.6 C)  TempSrc: Oral  SpO2: 100%  Weight: 269 lb 14.4 oz (122.4 kg)    Physical Exam: Constitutional: well-appearing male sitting in chair, in no acute distress Cardiovascular: regular rate and rhythm, no m/r/g Pulmonary/Chest: normal work of breathing on room air, lungs clear to auscultation bilaterally Breast: No masses/lumps palpated in bilateral breasts. No nipple discharge or nipple inversion/retraction. Breast tissue and areolas are all symmetric. MSK: normal bulk and tone Neurological: alert & oriented x 3, no focal deficit Skin: warm and dry Psych: normal mood and behavior  Assessment & Plan:     Patient discussed with Dr. Heide Spark  Nipple pain Patient presents to  the [facility] with 1 week of right-sided nipple pain.  He was seen in our clinic for this in July 2022 and underwent diagnostic mammography. In September 2022 the patient describes having a " scraping" procedure done and a biopsy, which confirmed calcifications in his right breast.  His pain resolved after these 2 procedures and he was not diagnosed with cancer.  He was advised to have a diagnostic mammogram 6 months after the procedure, which he completed in February 2022, and this simply showed benign right breast calcifications and no evidence of malignancy.  Patient states that his pain returned about 1 week ago and feels the exact same as it did 1 year ago.  He denies any palpable masses/lumps or nipple discharge.  He does not have any nipple pain at rest but only has  nipple pain upon palpation.  On exam there are no masses or lumps palpated in either breast.  He has no nipple discharge or nipple inversion/retraction.   Plan: - Diagnostic mammogram ordered today    Lavern Crimi, D.O. [facility] Internal Medicine, PGY-2 Phone: [phone] Date 12/04/2021 Time 3:57 PM

## 2021-12-05 NOTE — Progress Notes (Signed)
Internal Medicine Clinic Attending ° °Case discussed with Dr. Atway  At the time of the visit.  We reviewed the resident’s history and exam and pertinent patient test results.  I agree with the assessment, diagnosis, and plan of care documented in the resident’s note.  °

## 2021-12-06 ENCOUNTER — Other Ambulatory Visit: Payer: Self-pay | Admitting: Internal Medicine

## 2021-12-06 DIAGNOSIS — N644 Mastodynia: Secondary | ICD-10-CM

## 2021-12-18 ENCOUNTER — Ambulatory Visit
Admission: RE | Admit: 2021-12-18 | Discharge: 2021-12-18 | Disposition: A | Discharge status: Home / Self Care | Payer: Medicare Other | Source: Ambulatory Visit | Attending: Internal Medicine | Admitting: Internal Medicine

## 2021-12-18 ENCOUNTER — Ambulatory Visit: Payer: Medicare Other

## 2021-12-18 DIAGNOSIS — N644 Mastodynia: Secondary | ICD-10-CM

## 2021-12-19 NOTE — Progress Notes (Signed)
Unable to reach patient via telephone regarding mammogram results. Mammogram was normal - no evidence of malignancy in either breast with benign bilateral gynecomastia seen.   Patient advised to return for additional imaging if the affected area becomes larger or firmer, or if a new palpable abnormality is identified.

## 2021-12-23 ENCOUNTER — Other Ambulatory Visit: Payer: Self-pay | Admitting: Otolaryngology

## 2021-12-23 DIAGNOSIS — E041 Nontoxic single thyroid nodule: Secondary | ICD-10-CM

## 2021-12-25 ENCOUNTER — Ambulatory Visit
Admission: RE | Admit: 2021-12-25 | Discharge: 2021-12-25 | Disposition: A | Discharge status: Home / Self Care | Payer: Medicare Other | Source: Ambulatory Visit | Attending: Otolaryngology | Admitting: Otolaryngology

## 2021-12-25 DIAGNOSIS — E041 Nontoxic single thyroid nodule: Secondary | ICD-10-CM

## 2021-12-26 ENCOUNTER — Other Ambulatory Visit: Payer: Self-pay | Admitting: Internal Medicine

## 2022-01-06 ENCOUNTER — Other Ambulatory Visit (HOSPITAL_COMMUNITY): Payer: Self-pay | Admitting: Otolaryngology

## 2022-01-06 ENCOUNTER — Other Ambulatory Visit: Payer: Self-pay | Admitting: Otolaryngology

## 2022-01-06 DIAGNOSIS — E041 Nontoxic single thyroid nodule: Secondary | ICD-10-CM

## 2022-01-08 ENCOUNTER — Other Ambulatory Visit (HOSPITAL_COMMUNITY): Payer: Self-pay | Admitting: Otolaryngology

## 2022-01-08 DIAGNOSIS — E041 Nontoxic single thyroid nodule: Secondary | ICD-10-CM

## 2022-01-10 ENCOUNTER — Telehealth: Payer: Self-pay | Admitting: Podiatry

## 2022-01-10 NOTE — Telephone Encounter (Signed)
Patient called and stated that he would like a refill on terbinafine (LAMISIL) 250 MG tablet

## 2022-01-17 ENCOUNTER — Ambulatory Visit (HOSPITAL_COMMUNITY)
Admission: RE | Admit: 2022-01-17 | Discharge: 2022-01-17 | Disposition: A | Discharge status: Home / Self Care | Payer: Medicare Other | Source: Ambulatory Visit | Attending: Otolaryngology | Admitting: Otolaryngology

## 2022-01-17 DIAGNOSIS — E041 Nontoxic single thyroid nodule: Secondary | ICD-10-CM | POA: Diagnosis present

## 2022-01-17 MED ORDER — LIDOCAINE HCL (PF) 1 % IJ SOLN
INTRAMUSCULAR | Status: AC
  Filled 2022-01-17: qty 30

## 2022-01-22 LAB — CYTOLOGY - NON PAP

## 2022-01-24 ENCOUNTER — Ambulatory Visit (INDEPENDENT_AMBULATORY_CARE_PROVIDER_SITE_OTHER): Payer: Medicare Other | Admitting: Podiatry

## 2022-01-24 DIAGNOSIS — B351 Tinea unguium: Secondary | ICD-10-CM

## 2022-01-24 DIAGNOSIS — Z79899 Other long term (current) drug therapy: Secondary | ICD-10-CM

## 2022-01-24 NOTE — Progress Notes (Signed)
  Subjective:  Patient ID: Geraldo Pitter, male    DOB: [birth year],  MRN: [id number]  Chief Complaint  Patient presents with   Nail Problem    66 y.o. male presents with the above complaint. History confirmed with patient.  Patient is known to Dr. Sherryle Lis.  He is here to get another 54-month course  Objective:  Physical Exam: warm, good capillary refill, no trophic changes or ulcerative lesions, normal DP and PT pulses, and normal sensory exam.  Onychomycosis of the bilateral hallux nail plate, he has over 75% proximal clearing   Assessment:   1. Onychomycosis   2. High risk medications (not anticoagulants) long-term use   3. Nail fungus         Plan:  Patient was evaluated and treated and all questions answered.  Doing very well he is turned a corner and has had nearly some 5% resolution of the onychomycosis.  I recommend we continue using Lamisil until completely eradicated.  I sent in another 90-day course for this and he will follow-up with me in 4 months.   No follow-ups on file.

## 2022-01-25 LAB — HEPATIC FUNCTION PANEL
AG Ratio: 1.5 (calc) (ref 1.0–2.5)
ALT: 20 U/L (ref 9–46)
AST: 20 U/L (ref 10–35)
Albumin: 4.1 g/dL (ref 3.6–5.1)
Alkaline phosphatase (APISO): 66 U/L (ref 35–144)
Bilirubin, Direct: 0.1 mg/dL (ref 0.0–0.2)
Globulin: 2.8 g/dL (calc) (ref 1.9–3.7)
Indirect Bilirubin: 0.2 mg/dL (calc) (ref 0.2–1.2)
Total Bilirubin: 0.3 mg/dL (ref 0.2–1.2)
Total Protein: 6.9 g/dL (ref 6.1–8.1)

## 2022-01-27 MED ORDER — TERBINAFINE HCL 250 MG PO TABS: 250.0000 mg | ORAL_TABLET | Freq: Every day | ORAL | 0 refills | Status: DC

## 2022-01-27 NOTE — Addendum Note (Signed)
Addended by: Shalunda Lindh on: 01/27/2022 08:17 AM   Modules accepted: Orders  

## 2022-02-11 ENCOUNTER — Encounter (HOSPITAL_COMMUNITY): Payer: Self-pay

## 2022-04-30 ENCOUNTER — Telehealth: Payer: Self-pay | Admitting: Podiatry

## 2022-04-30 MED ORDER — TERBINAFINE HCL 250 MG PO TABS: 250.0000 mg | ORAL_TABLET | Freq: Every day | ORAL | 0 refills | Status: DC

## 2022-04-30 NOTE — Telephone Encounter (Signed)
Pt called in to request a Rx refill on script for Lamisil. Please advise.

## 2022-05-15 DIAGNOSIS — E042 Nontoxic multinodular goiter: Secondary | ICD-10-CM | POA: Diagnosis not present

## 2022-05-15 DIAGNOSIS — E041 Nontoxic single thyroid nodule: Secondary | ICD-10-CM | POA: Diagnosis not present

## 2022-05-15 DIAGNOSIS — I10 Essential (primary) hypertension: Secondary | ICD-10-CM | POA: Diagnosis not present

## 2022-05-15 DIAGNOSIS — Z87891 Personal history of nicotine dependence: Secondary | ICD-10-CM | POA: Diagnosis not present

## 2022-05-15 HISTORY — PX: THYROIDECTOMY: SHX17

## 2022-05-16 DIAGNOSIS — Z87891 Personal history of nicotine dependence: Secondary | ICD-10-CM | POA: Diagnosis not present

## 2022-05-16 DIAGNOSIS — I10 Essential (primary) hypertension: Secondary | ICD-10-CM | POA: Diagnosis not present

## 2022-05-16 DIAGNOSIS — E042 Nontoxic multinodular goiter: Secondary | ICD-10-CM | POA: Diagnosis not present

## 2022-05-22 ENCOUNTER — Encounter: Payer: Self-pay | Admitting: Internal Medicine

## 2022-05-22 ENCOUNTER — Other Ambulatory Visit: Payer: Self-pay

## 2022-05-22 ENCOUNTER — Ambulatory Visit (INDEPENDENT_AMBULATORY_CARE_PROVIDER_SITE_OTHER): Payer: Medicare HMO | Admitting: Internal Medicine

## 2022-05-22 VITALS — BP 111/81 | HR 95 | Temp 98.2°F | Ht 71.0 in | Wt 272.4 lb

## 2022-05-22 DIAGNOSIS — Z87891 Personal history of nicotine dependence: Secondary | ICD-10-CM

## 2022-05-22 DIAGNOSIS — N62 Hypertrophy of breast: Secondary | ICD-10-CM | POA: Diagnosis not present

## 2022-05-22 DIAGNOSIS — R7989 Other specified abnormal findings of blood chemistry: Secondary | ICD-10-CM | POA: Diagnosis not present

## 2022-05-22 DIAGNOSIS — L989 Disorder of the skin and subcutaneous tissue, unspecified: Secondary | ICD-10-CM | POA: Diagnosis not present

## 2022-05-22 DIAGNOSIS — I10 Essential (primary) hypertension: Secondary | ICD-10-CM

## 2022-05-22 NOTE — Progress Notes (Signed)
Established Patient Office Visit  Subjective   Patient ID: Timothy Harvey, male    DOB: 1956/01/02  Age: 67 y.o. MRN: [id number]  Chief Complaint  Patient presents with   Meet new doctor    Mole on right leg. S/P thyroidectomy.   [name] presents today for follow-up of hypertension and thyroid nodules, as well as to meet me he has recently been reassigned to my patient panel.  He is overall feeling well today he underwent right thyroidectomy at [facility] last week and has follow-up with ENT in about a week.  He notes that surgical drain was removed yesterday overall feels everything went well no changes in voice.  Surgical pathology was benign.  He is taking his antihypertensive medication as directed he has no side effects.  We discussed that his weight has increased some over the past 2 years.  He notes that in retirement and after his move from [place] he takes care of a small yard and is less active.  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  We reviewed some of his overdue healthcare maintenance items overall he describes his philosophy is less preventative and more dealing with issues as they arise but occasionally will get some preventative care done.  He underwent a diagnostic mammogram for some breast tenderness it was benign noted to have some mild bilateral gynecomastia.  He does note some generalized fatigue decreased libido and decreased erections.  Is interested in having testosterone tested.     Objective:     BP 111/81 (BP Location: Left Arm, Patient Position: Sitting, Cuff Size: Large)   Pulse 95   Temp 98.2 F (36.8 C) (Oral)   Ht 5\' 11"  (1.803 m)   Wt 272 lb 6.4 oz (123.6 kg)   SpO2 98% Comment: RA  BMI 37.99 kg/m  BP Readings from Last 3 Encounters:  05/22/22 111/81  12/04/21 114/78  09/17/21 (!) 136/94   Wt Readings from Last 3 Encounters:  05/22/22 272 lb 6.4 oz (123.6 kg)  12/04/21 269 lb 14.4 oz (122.4 kg)  09/17/21  268 lb (121.6 kg)      Physical Exam Constitutional:      Appearance: Normal appearance.  Pulmonary:     Effort: Pulmonary effort is normal.  Skin:    Comments: Hyperpigmented papule on right lower extremity  Well healing thyroidectomy surgical site, no erythema or tenderness  Neurological:     Mental Status: He is alert.  Psychiatric:        Mood and Affect: Mood normal.        Behavior: Behavior normal.      No results found for any visits on 05/22/22.  Last CBC No results found for: "WBC", "HGB", "HCT", "MCV", "MCH", "RDW", "PLT" Last metabolic panel Lab Results  Component Value Date   PROT 6.9 01/24/2022   BILITOT 0.3 01/24/2022   AST 20 01/24/2022   ALT 20 01/24/2022      The 10-year ASCVD risk score (Arnett DK, et al., 2019) is: 14.4%    Assessment & Plan:   Problem List Items Addressed This Visit       Cardiovascular and Mediastinum   Hypertension - Primary    BP well controlled with losartan 100mg  and HCTZ 25mg .  Check labs      Relevant Orders   CMP14 + Anion Gap   CBC no Diff   Lipid Profile     Other   Low vitamin D level  Relevant Orders   Vitamin D (25 hydroxy)   Gynecomastia   Relevant Orders   Testosterone,Free and Total   Other Visit Diagnoses     Skin lesion of right leg       Relevant Orders   Ambulatory referral to Dermatology       Return in about 6 months (around 11/20/2022).    Lucious Groves, DO

## 2022-05-22 NOTE — Assessment & Plan Note (Signed)
BP well controlled with losartan 100mg  and HCTZ 25mg .  Check labs

## 2022-05-31 LAB — CMP14 + ANION GAP
ALT: 23 IU/L (ref 0–44)
AST: 21 IU/L (ref 0–40)
Albumin/Globulin Ratio: 1.4 (ref 1.2–2.2)
Albumin: 4.2 g/dL (ref 3.9–4.9)
Alkaline Phosphatase: 81 IU/L (ref 44–121)
Anion Gap: 15 mmol/L (ref 10.0–18.0)
BUN/Creatinine Ratio: 17 (ref 10–24)
BUN: 22 mg/dL (ref 8–27)
Bilirubin Total: <0.2 mg/dL (ref 0.0–1.2)
CO2: 27 mmol/L (ref 20–29)
Calcium: 9.1 mg/dL (ref 8.6–10.2)
Chloride: 99 mmol/L (ref 96–106)
Creatinine, Ser: 1.28 mg/dL — ABNORMAL HIGH (ref 0.76–1.27)
Globulin, Total: 3 g/dL (ref 1.5–4.5)
Glucose: 124 mg/dL — ABNORMAL HIGH (ref 70–99)
Potassium: 3.8 mmol/L (ref 3.5–5.2)
Sodium: 141 mmol/L (ref 134–144)
Total Protein: 7.2 g/dL (ref 6.0–8.5)
eGFR: 61 mL/min/{1.73_m2} (ref >59)

## 2022-05-31 LAB — CBC
Hematocrit: 37.7 % (ref 37.5–51.0)
Hemoglobin: 12.8 g/dL — ABNORMAL LOW (ref 13.0–17.7)
MCH: 31.6 pg (ref 26.6–33.0)
MCHC: 34 g/dL (ref 31.5–35.7)
MCV: 93 fL (ref 79–97)
Platelets: 188 10*3/uL (ref 150–450)
RBC: 4.05 x10E6/uL — ABNORMAL LOW (ref 4.14–5.80)
RDW: 11.9 % (ref 11.6–15.4)
WBC: 5.3 10*3/uL (ref 3.4–10.8)

## 2022-05-31 LAB — LIPID PANEL
Chol/HDL Ratio: 4.1 ratio (ref 0.0–5.0)
Cholesterol, Total: 196 mg/dL (ref 100–199)
HDL: 48 mg/dL (ref >39)
LDL Chol Calc (NIH): 120 mg/dL — ABNORMAL HIGH (ref 0–99)
Triglycerides: 158 mg/dL — ABNORMAL HIGH (ref 0–149)
VLDL Cholesterol Cal: 28 mg/dL (ref 5–40)

## 2022-05-31 LAB — VITAMIN D 25 HYDROXY (VIT D DEFICIENCY, FRACTURES): Vit D, 25-Hydroxy: 25.2 ng/mL — ABNORMAL LOW (ref 30.0–100.0)

## 2022-05-31 LAB — TESTOSTERONE,FREE AND TOTAL
Testosterone, Free: 2.2 pg/mL — ABNORMAL LOW (ref 6.6–18.1)
Testosterone: 248 ng/dL — ABNORMAL LOW (ref 264–916)

## 2022-06-02 ENCOUNTER — Telehealth: Payer: Self-pay | Admitting: Internal Medicine

## 2022-06-02 DIAGNOSIS — N62 Hypertrophy of breast: Secondary | ICD-10-CM

## 2022-06-02 MED ORDER — VITAMIN D3 25 MCG (1000 UNIT) PO TABS: 1000.0000 [IU] | ORAL_TABLET | Freq: Every day | ORAL | Status: AC

## 2022-06-02 NOTE — Telephone Encounter (Signed)
Placed call and updated Kain about lab results. Instructed to take an additional Vitamin D3 1000 OU daily. Testosterone labs low, will have him come back for early morning lab recheck.

## 2022-06-03 DIAGNOSIS — E890 Postprocedural hypothyroidism: Secondary | ICD-10-CM | POA: Diagnosis not present

## 2022-06-03 DIAGNOSIS — Z886 Allergy status to analgesic agent status: Secondary | ICD-10-CM | POA: Diagnosis not present

## 2022-06-03 DIAGNOSIS — Z4889 Encounter for other specified surgical aftercare: Secondary | ICD-10-CM | POA: Diagnosis not present

## 2022-06-10 ENCOUNTER — Other Ambulatory Visit (INDEPENDENT_AMBULATORY_CARE_PROVIDER_SITE_OTHER): Payer: Federal, State, Local not specified - PPO

## 2022-06-10 DIAGNOSIS — Z23 Encounter for immunization: Secondary | ICD-10-CM

## 2022-06-10 DIAGNOSIS — E291 Testicular hypofunction: Secondary | ICD-10-CM | POA: Diagnosis not present

## 2022-06-10 DIAGNOSIS — N62 Hypertrophy of breast: Secondary | ICD-10-CM

## 2022-06-10 DIAGNOSIS — Z0000 Encounter for general adult medical examination without abnormal findings: Secondary | ICD-10-CM

## 2022-06-10 NOTE — Addendum Note (Signed)
Addended by: Marcelino Duster on: 06/10/2022 11:11 AM   Modules accepted: Orders

## 2022-06-10 NOTE — Progress Notes (Signed)
While getting labs patient requested referral to GI for screening colonoscopy.  Will place referral.  Also asks about thyroid testing.  He received a partial thyroidectomy on 1/18.  It is a little early to check the thyroid labs, will check around the 6 week post mark.

## 2022-06-10 NOTE — Addendum Note (Signed)
Addended by: Joni Reining C on: 06/10/2022 10:03 AM   Modules accepted: Orders

## 2022-06-10 NOTE — Addendum Note (Signed)
Addended by: Marcelino Duster on: 06/10/2022 08:46 AM   Modules accepted: Orders

## 2022-06-12 ENCOUNTER — Telehealth: Payer: Self-pay | Admitting: Internal Medicine

## 2022-06-12 DIAGNOSIS — E291 Testicular hypofunction: Secondary | ICD-10-CM

## 2022-06-12 DIAGNOSIS — E890 Postprocedural hypothyroidism: Secondary | ICD-10-CM

## 2022-06-12 DIAGNOSIS — R7989 Other specified abnormal findings of blood chemistry: Secondary | ICD-10-CM

## 2022-06-12 NOTE — Telephone Encounter (Signed)
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 Will check homonal labs, due for TSH check in about 2 weeks due to partial thyroidectomy.  Thus called patient to come in for 8:30 lab visit in 1-2 weeks.

## 2022-06-13 NOTE — Addendum Note (Signed)
Addended by: Joni Reining C on: 06/13/2022 10:52 AM   Modules accepted: Orders

## 2022-06-18 ENCOUNTER — Other Ambulatory Visit (INDEPENDENT_AMBULATORY_CARE_PROVIDER_SITE_OTHER): Payer: Medicare HMO

## 2022-06-18 DIAGNOSIS — E291 Testicular hypofunction: Secondary | ICD-10-CM | POA: Diagnosis not present

## 2022-06-18 DIAGNOSIS — R7989 Other specified abnormal findings of blood chemistry: Secondary | ICD-10-CM | POA: Diagnosis not present

## 2022-06-18 DIAGNOSIS — E890 Postprocedural hypothyroidism: Secondary | ICD-10-CM | POA: Diagnosis not present

## 2022-06-18 LAB — LUTEINIZING HORMONE: LH: 6.7 m[IU]/mL (ref 1.7–8.6)

## 2022-06-18 LAB — TESTOSTERONE,FREE AND TOTAL
Testosterone, Free: 2.7 pg/mL — ABNORMAL LOW (ref 6.6–18.1)
Testosterone: 139 ng/dL — ABNORMAL LOW (ref 264–916)

## 2022-06-18 LAB — FOLLICLE STIMULATING HORMONE: FSH: 10.1 m[IU]/mL (ref 1.5–12.4)

## 2022-06-20 ENCOUNTER — Encounter: Payer: Self-pay | Admitting: Gastroenterology

## 2022-06-20 DIAGNOSIS — E291 Testicular hypofunction: Secondary | ICD-10-CM | POA: Insufficient documentation

## 2022-06-20 NOTE — Addendum Note (Signed)
Addended by: Lucious Groves on: 06/20/2022 01:47 PM   Modules accepted: Orders

## 2022-06-20 NOTE — Assessment & Plan Note (Signed)
Gynecomastia and generalized fatigue, have obtained testosterone labs x 3 with low results.  Has family history of prostate cancer and psa of 5.7,  will refer to urology for prostate evaluation prior to testosterone therapy, suspect they will also treat and follow him going forward.

## 2022-06-25 LAB — IRON,TIBC AND FERRITIN PANEL
Ferritin: 301 ng/mL (ref 30–400)
Iron Saturation: 22 % (ref 15–55)
Iron: 63 ug/dL (ref 38–169)
Total Iron Binding Capacity: 284 ug/dL (ref 250–450)
UIBC: 221 ug/dL (ref 111–343)

## 2022-06-25 LAB — CORTISOL: Cortisol: 14.7 ug/dL (ref 6.2–19.4)

## 2022-06-25 LAB — PROLACTIN: Prolactin: 14.2 ng/mL (ref 3.6–25.2)

## 2022-06-25 LAB — PSA: Prostate Specific Ag, Serum: 5.9 ng/mL — ABNORMAL HIGH (ref 0.0–4.0)

## 2022-06-25 LAB — VITAMIN D 25 HYDROXY (VIT D DEFICIENCY, FRACTURES): Vit D, 25-Hydroxy: 27.5 ng/mL — ABNORMAL LOW (ref 30.0–100.0)

## 2022-06-25 LAB — T4, FREE: Free T4: 1.18 ng/dL (ref 0.82–1.77)

## 2022-06-25 LAB — TESTOSTERONE,FREE AND TOTAL
Testosterone, Free: 2.8 pg/mL — ABNORMAL LOW (ref 6.6–18.1)
Testosterone: 124 ng/dL — ABNORMAL LOW (ref 264–916)

## 2022-06-25 LAB — TSH: TSH: 4.42 u[IU]/mL (ref 0.450–4.500)

## 2022-07-09 ENCOUNTER — Telehealth: Payer: Self-pay

## 2022-07-09 NOTE — Telephone Encounter (Signed)
Pt is requesting a call back  about his urology appt  he stated no one called him from the office to set it up as of yet

## 2022-07-17 ENCOUNTER — Encounter: Payer: Self-pay | Admitting: Gastroenterology

## 2022-07-17 ENCOUNTER — Ambulatory Visit: Payer: Medicare HMO

## 2022-07-17 VITALS — Ht 71.0 in | Wt 270.0 lb

## 2022-07-17 DIAGNOSIS — Z1211 Encounter for screening for malignant neoplasm of colon: Secondary | ICD-10-CM

## 2022-07-17 MED ORDER — NA SULFATE-K SULFATE-MG SULF 17.5-3.13-1.6 GM/177ML PO SOLN: 1.0000 | Freq: Once | ORAL | 0 refills | Status: AC

## 2022-07-17 NOTE — Progress Notes (Signed)
Pre visit completed via phone call; Patient verified name, DOB, and address;  No egg or soy allergy known to patient;  No issues known to pt with past sedation with any surgeries or procedures; Patient denies ever being told they had issues or difficulty with intubation;  No FH of Malignant Hyperthermia; Pt is not on diet pills; Pt is not on home 02;  Pt is not on blood thinners;  Pt denies issues with constipation- patient reports he takes a stool softener daily to ensure he does not have constipation issues; No A fib or A flutter; Have any cardiac testing pending--NO Pt instructed to use Singlecare.com or GoodRx for a price reduction on prep;   Insurance verified during [place] appt=[employer]  Patient's chart reviewed by Osvaldo Angst CNRA prior to previsit and patient appropriate for the [place].  Previsit completed and red dot placed by patient's name on their procedure day (on provider's schedule).    [facility] Good[facility] coupon and instructions printed and placed at 2nd floor receptionist for patient to pick up per his request;

## 2022-07-22 DIAGNOSIS — L820 Inflamed seborrheic keratosis: Secondary | ICD-10-CM | POA: Diagnosis not present

## 2022-07-31 ENCOUNTER — Encounter: Payer: Self-pay | Admitting: Gastroenterology

## 2022-07-31 ENCOUNTER — Ambulatory Visit: Payer: Medicare HMO | Admitting: Gastroenterology

## 2022-07-31 VITALS — BP 109/69 | HR 75 | Temp 97.5°F | Resp 14 | Ht 71.0 in | Wt 270.0 lb

## 2022-07-31 DIAGNOSIS — D123 Benign neoplasm of transverse colon: Secondary | ICD-10-CM | POA: Diagnosis not present

## 2022-07-31 DIAGNOSIS — I10 Essential (primary) hypertension: Secondary | ICD-10-CM | POA: Diagnosis not present

## 2022-07-31 DIAGNOSIS — Z1211 Encounter for screening for malignant neoplasm of colon: Secondary | ICD-10-CM | POA: Diagnosis not present

## 2022-07-31 DIAGNOSIS — E785 Hyperlipidemia, unspecified: Secondary | ICD-10-CM | POA: Diagnosis not present

## 2022-07-31 MED ORDER — SODIUM CHLORIDE 0.9 % IV SOLN: 500.0000 mL | Freq: Once | INTRAVENOUS | Status: DC

## 2022-07-31 NOTE — Progress Notes (Signed)
Sedate, gd SR, tolerated procedure well, VSS, report to RN 

## 2022-07-31 NOTE — Progress Notes (Signed)
Called to room to assist during endoscopic procedure.  Patient ID and intended procedure confirmed with present staff. Received instructions for my participation in the procedure from the performing physician.  

## 2022-07-31 NOTE — Progress Notes (Signed)
Pt's states no medical or surgical changes since previsit or office visit. 

## 2022-07-31 NOTE — Patient Instructions (Signed)
Resume previous diet and medications. Awaiting pathology results. Repeat Colonoscopy date to be determined based on pathology results.  YOU HAD AN ENDOSCOPIC PROCEDURE TODAY AT THE [facility]:   Refer to the procedure report that was given to you for any specific questions about what was found during the examination.  If the procedure report does not answer your questions, please call your gastroenterologist to clarify.  If you requested that your care partner not be given the details of your procedure findings, then the procedure report has been included in a sealed envelope for you to review at your convenience later.  YOU SHOULD EXPECT: Some feelings of bloating in the abdomen. Passage of more gas than usual.  Walking can help get rid of the air that was put into your GI tract during the procedure and reduce the bloating. If you had a lower endoscopy (such as a colonoscopy or flexible sigmoidoscopy) you may notice spotting of blood in your stool or on the toilet paper. If you underwent a bowel prep for your procedure, you may not have a normal bowel movement for a few days.  Please Note:  You might notice some irritation and congestion in your nose or some drainage.  This is from the oxygen used during your procedure.  There is no need for concern and it should clear up in a day or so.  SYMPTOMS TO REPORT IMMEDIATELY:  Following lower endoscopy (colonoscopy or flexible sigmoidoscopy):  Excessive amounts of blood in the stool  Significant tenderness or worsening of abdominal pains  Swelling of the abdomen that is new, acute  Fever of 100F or higher  For urgent or emergent issues, a gastroenterologist can be reached at any hour by calling [phone]. Do not use MyChart messaging for urgent concerns.    DIET:  We do recommend a small meal at first, but then you may proceed to your regular diet.  Drink plenty of fluids but you should avoid alcoholic beverages for 24  hours.  ACTIVITY:  You should plan to take it easy for the rest of today and you should NOT DRIVE or use heavy machinery until tomorrow (because of the sedation medicines used during the test).    FOLLOW UP: Our staff will call the number listed on your records the next business day following your procedure.  We will call around 7:15- 8:00 am to check on you and address any questions or concerns that you may have regarding the information given to you following your procedure. If we do not reach you, we will leave a message.     If any biopsies were taken you will be contacted by phone or by letter within the next 1-3 weeks.  Please call us at [phone] if you have not heard about the biopsies in 3 weeks.    SIGNATURES/CONFIDENTIALITY: You and/or your care partner have signed paperwork which will be entered into your electronic medical record.  These signatures attest to the fact that that the information above on your After Visit Summary has been reviewed and is understood.  Full responsibility of the confidentiality of this discharge information lies with you and/or your care-partner.  

## 2022-07-31 NOTE — Op Note (Signed)
[place] Patient Name: Timothy Harvey Procedure Date: 07/31/2022 3:02 PM MRN: [id number] Endoscopist: Nicki Reaper E. Candis Schatz , MD, [id number] Age: 67 Referring MD:  Date of Birth: February 03, 1956 Gender: Male Account #: [id number] Procedure:                Colonoscopy Indications:              Screening for colorectal malignant neoplasm (last                            colonoscopy was more than 10 years ago) Medicines:                Monitored Anesthesia Care Procedure:                Pre-Anesthesia Assessment:                           - Prior to the procedure, a History and Physical                            was performed, and patient medications and                            allergies were reviewed. The patient's tolerance of                            previous anesthesia was also reviewed. The risks                            and benefits of the procedure and the sedation                            options and risks were discussed with the patient.                            All questions were answered, and informed consent                            was obtained. Prior Anticoagulants: The patient has                            taken no anticoagulant or antiplatelet agents. ASA                            Grade Assessment: II - A patient with mild systemic                            disease. After reviewing the risks and benefits,                            the patient was deemed in satisfactory condition to                            undergo the procedure.  After obtaining informed consent, the colonoscope                            was passed under direct vision. Throughout the                            procedure, the patient's blood pressure, pulse, and                            oxygen saturations were monitored continuously. The                            CF [id number] was introduced through the anus                            and advanced  to the the cecum, identified by                            appendiceal orifice and ileocecal valve. The                            colonoscopy was performed without difficulty. The                            patient tolerated the procedure well. The quality                            of the bowel preparation was adequate. The                            ileocecal valve, appendiceal orifice, and rectum                            were photographed. The bowel preparation used was                            SUPREP via split dose instruction. Scope In: 3:19:33 PM Scope Out: 3:35:20 PM Scope Withdrawal Time: 0 hours 12 minutes 30 seconds  Total Procedure Duration: 0 hours 15 minutes 47 seconds  Findings:                 The perianal and digital rectal examinations were                            normal. Pertinent negatives include normal                            sphincter tone and no palpable rectal lesions.                           Two sessile polyps were found in the transverse                            colon. The polyps were 2 to 3 mm in size.  These                            polyps were removed with a cold snare. Resection                            and retrieval were complete. Estimated blood loss                            was minimal.                           Multiple medium-mouthed and small-mouthed                            diverticula were found in the sigmoid colon,                            descending colon and distal transverse colon. There                            was no evidence of diverticular bleeding.                           The exam was otherwise normal throughout the                            examined colon.                           The retroflexed view of the distal rectum and anal                            verge was normal and showed no anal or rectal                            abnormalities. Complications:            No immediate complications. Estimated  Blood Loss:     Estimated blood loss was minimal. Impression:               - Two 2 to 3 mm polyps in the transverse colon,                            removed with a cold snare. Resected and retrieved.                           - Moderate diverticulosis in the sigmoid colon, in                            the descending colon and in the distal transverse                            colon. There was no evidence of diverticular  bleeding.                           - The distal rectum and anal verge are normal on                            retroflexion view. Recommendation:           - Patient has a contact number available for                            emergencies. The signs and symptoms of potential                            delayed complications were discussed with the                            patient. Return to normal activities tomorrow.                            Written discharge instructions were provided to the                            patient.                           - Resume previous diet.                           - Continue present medications.                           - Await pathology results.                           - Repeat colonoscopy (date not yet determined) for                            surveillance based on pathology results. [name], MD 07/31/2022 3:41:48 PM This report has been signed electronically.

## 2022-07-31 NOTE — Progress Notes (Signed)
[facility] History and Physical   Primary Care Physician:  Lucious Groves, DO   Reason for Procedure:   Colon cancer screening  Plan:    Screening colonoscopy     HPI: Timothy Harvey is a 67 y.o. male undergoing average risk screening colonoscopy.  He has no family history of colon cancer and no chronic GI symptoms. He had a colonoscopy about 15 years ago which he says was normal.   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 Authorizing Provider  cholecalciferol (VITAMIN D3) 25 MCG (1000 UNIT) tablet Take 1 tablet (1,000 Units total) by mouth daily. 06/02/22  Yes Lucious Groves, DO  Docusate Calcium (STOOL SOFTENER PO) Take 2 tablets by mouth daily at 6 (six) AM.   Yes [provider]  hydrochlorothiazide (HYDRODIURIL) 25 MG tablet Take 25 mg by mouth daily. 09/15/21  Yes [provider]  latanoprost (XALATAN) 0.005 % ophthalmic solution Place 1 drop into both eyes at bedtime. 05/08/21  Yes [provider]  losartan (COZAAR) 100 MG tablet TAKE 1 TABLET BY MOUTH EVERY DAY 12/27/21  Yes Linus Galas, MD  Omega-3 Fatty Acids (FISH OIL) 1000 MG CAPS Take 2,000 mg by mouth daily.   Yes [provider]  simvastatin (ZOCOR) 20 MG tablet TAKE 1 TABLET BY MOUTH EVERY DAY IN THE EVENING 10/07/21  Yes Amponsah, Charisse March, MD  terbinafine (LAMISIL) 250 MG tablet Take 1 tablet (250 mg total) by mouth daily. 04/30/22   Yes McDonald, Adam R, DPM  GARLIC PO Take 0000000 mg by mouth daily. Patient not taking: Reported on 07/17/2022    [provider]  Multiple Vitamins-Minerals (MENS MULTIVITAMIN PO) Take 1 tablet by mouth daily. Patient not taking: Reported on 07/17/2022    [provider]    Current Outpatient Medications  Medication Sig Dispense Refill   cholecalciferol (VITAMIN D3) 25 MCG (1000 UNIT) tablet Take 1 tablet (1,000 Units total) by mouth daily.     Docusate Calcium (STOOL SOFTENER PO) Take 2 tablets by mouth daily at 6 (six) AM.     hydrochlorothiazide (HYDRODIURIL) 25 MG tablet Take 25 mg by mouth daily.     latanoprost (XALATAN) 0.005 % ophthalmic solution Place 1 drop into both eyes at bedtime.     losartan (COZAAR) 100 MG tablet TAKE 1 TABLET BY MOUTH EVERY DAY 90 tablet 3   Omega-3 Fatty Acids (FISH OIL) 1000 MG CAPS Take 2,000 mg by mouth daily.     simvastatin (ZOCOR) 20 MG tablet TAKE 1 TABLET BY MOUTH EVERY DAY IN THE EVENING 90 tablet 3   terbinafine (LAMISIL) 250 MG tablet Take 1 tablet (250 mg total) by mouth daily. 90 tablet 0   GARLIC PO Take 0000000 mg by mouth daily. (Patient not taking: Reported on 07/17/2022)     Multiple Vitamins-Minerals (MENS MULTIVITAMIN PO)  Take 1 tablet by mouth daily. (Patient not taking: Reported on 07/17/2022)     Current Facility-Administered Medications  Medication Dose Route Frequency Provider Last Rate Last Admin   0.9 %  sodium chloride infusion  500 mL Intravenous Once Daryel November, MD        Allergies as of 07/31/2022 - Review Complete 07/31/2022  Allergen Reaction Noted   Aspirin  01/14/2022    Family History  Problem Relation Age of Onset   Hypertension Mother    Stroke Mother    Prostate cancer Father 74   Breast cancer Sister 61   Prostate cancer Brother 79   Prostate cancer Brother 35   Colon polyps Neg Hx    Colon cancer Neg Hx    Esophageal cancer Neg Hx    Rectal cancer Neg Hx    Stomach cancer Neg Hx      Social History   Socioeconomic History   Marital status: Married    Spouse name: Not on file   Number of children: Not on file   Years of education: Not on file   Highest education level: Not on file  Occupational History   Not on file  Tobacco Use   Smoking status: Former    Packs/day: .5    Types: Cigarettes    Quit date: 1997    Years since quitting: 27.2   Smokeless tobacco: Never  Vaping Use   Vaping Use: Never used  Substance and Sexual Activity   Alcohol use: Yes    Comment: Occasionally.   Drug use: Never   Sexual activity: Not on file  Other Topics Concern   Not on file  Social History Narrative   Not on file   Social Determinants of Health   Financial Resource Strain: Not on file  Food Insecurity: Not on file  Transportation Needs: Not on file  Physical Activity: Not on file  Stress: Not on file  Social Connections: Not on file  Intimate Partner Violence: Not on file    Review of Systems:  All other review of systems negative except as mentioned in the HPI.  Physical Exam: Vital signs BP 107/70   Pulse 78   Temp (!) 97.5 F (36.4 C)   Ht 5\' 11"  (1.803 m)   Wt 270 lb (122.5 kg)   SpO2 97%   BMI 37.66 kg/m   General:   Alert,  Well-developed, well-nourished, pleasant and cooperative in NAD Airway:  Mallampati 1 Lungs:  Clear throughout to auscultation.   Heart:  Regular rate and rhythm; no murmurs, clicks, rubs,  or gallops. Abdomen:  Soft, nontender and nondistended. Normal bowel sounds.   Neuro/Psych:  Normal mood and affect. A and O x 3   Connie Lasater E. Candis Schatz, MD [facility]

## 2022-08-01 ENCOUNTER — Telehealth: Payer: Self-pay

## 2022-08-01 NOTE — Telephone Encounter (Signed)
Left message on answering machine. 

## 2022-08-08 DIAGNOSIS — R972 Elevated prostate specific antigen [PSA]: Secondary | ICD-10-CM | POA: Diagnosis not present

## 2022-08-08 DIAGNOSIS — E349 Endocrine disorder, unspecified: Secondary | ICD-10-CM | POA: Diagnosis not present

## 2022-08-12 ENCOUNTER — Encounter: Payer: Self-pay | Admitting: Gastroenterology

## 2022-09-02 ENCOUNTER — Ambulatory Visit: Payer: Medicare HMO | Admitting: Podiatry

## 2022-09-02 DIAGNOSIS — B351 Tinea unguium: Secondary | ICD-10-CM | POA: Diagnosis not present

## 2022-09-02 DIAGNOSIS — L603 Nail dystrophy: Secondary | ICD-10-CM | POA: Diagnosis not present

## 2022-09-02 MED ORDER — TERBINAFINE HCL 250 MG PO TABS: 250.0000 mg | ORAL_TABLET | Freq: Every day | ORAL | 0 refills | Status: AC

## 2022-09-03 LAB — COMPREHENSIVE METABOLIC PANEL
ALT: 18 IU/L (ref 0–44)
AST: 18 IU/L (ref 0–40)
Albumin/Globulin Ratio: 1.4 (ref 1.2–2.2)
Albumin: 4 g/dL (ref 3.9–4.9)
Alkaline Phosphatase: 76 IU/L (ref 44–121)
BUN/Creatinine Ratio: 13 (ref 10–24)
BUN: 15 mg/dL (ref 8–27)
Bilirubin Total: 0.3 mg/dL (ref 0.0–1.2)
CO2: 25 mmol/L (ref 20–29)
Calcium: 9.2 mg/dL (ref 8.6–10.2)
Chloride: 97 mmol/L (ref 96–106)
Creatinine, Ser: 1.19 mg/dL (ref 0.76–1.27)
Globulin, Total: 2.8 g/dL (ref 1.5–4.5)
Glucose: 95 mg/dL (ref 70–99)
Potassium: 3.9 mmol/L (ref 3.5–5.2)
Sodium: 140 mmol/L (ref 134–144)
Total Protein: 6.8 g/dL (ref 6.0–8.5)
eGFR: 67 mL/min/{1.73_m2} (ref >59)

## 2022-09-08 NOTE — Progress Notes (Signed)
  Subjective:  Patient ID: Timothy Harvey, male    DOB: [birth year],  MRN: [id number]  Chief Complaint  Patient presents with   Nail Problem    Restart nail fungus treatment    67 y.o. male presents with the above complaint. History confirmed with patient.  He is satisfied with appearance before, would like to continue treatment  Objective:  Physical Exam: warm, good capillary refill, no trophic changes or ulcerative lesions, normal DP and PT pulses, and normal sensory exam.  Onychomycosis of the bilateral hallux nail plate, he has over 90 % proximal clearing          Assessment:   1. Onychomycosis        Plan:  Patient was evaluated and treated and all questions answered.  Doing very well he is turned a corner and has had nearly 90% resolution of the onychomycosis.  I recommend we continue using Lamisil until completely eradicated.  I sent in another 90-day course for this and he will follow-up with me in 4 months.   Return in about 4 months (around 01/03/2023) for follow up after nail fungus treatment.

## 2022-10-09 DIAGNOSIS — N411 Chronic prostatitis: Secondary | ICD-10-CM | POA: Diagnosis not present

## 2022-10-09 DIAGNOSIS — R972 Elevated prostate specific antigen [PSA]: Secondary | ICD-10-CM | POA: Diagnosis not present

## 2022-10-09 DIAGNOSIS — C61 Malignant neoplasm of prostate: Secondary | ICD-10-CM | POA: Diagnosis not present

## 2022-10-15 ENCOUNTER — Other Ambulatory Visit: Payer: Self-pay | Admitting: Student

## 2022-10-15 DIAGNOSIS — E785 Hyperlipidemia, unspecified: Secondary | ICD-10-CM

## 2022-10-15 DIAGNOSIS — C61 Malignant neoplasm of prostate: Secondary | ICD-10-CM | POA: Diagnosis not present

## 2022-10-17 DIAGNOSIS — C61 Malignant neoplasm of prostate: Secondary | ICD-10-CM | POA: Diagnosis not present

## 2022-10-31 IMAGING — US US  BREAST BX W/ LOC DEV 1ST LESION IMG BX SPEC US GUIDE*R*
1 series · 12 of 25 positions shown · non-contrast
Comparison: Previous exam(s).
COMPARISON: Previous exam(s).

Addendum:
CLINICAL DATA: Patient presents for ultrasound-guided core biopsy
of RIGHT breast.

EXAM:
ULTRASOUND GUIDED RIGHT BREAST CORE NEEDLE BIOPSY

[Series 1: us breast bx w/ loc dev 1st lesion img bx spec us  · 0.07mm/px · 12 of 27 slices shown]
[im 2/27]
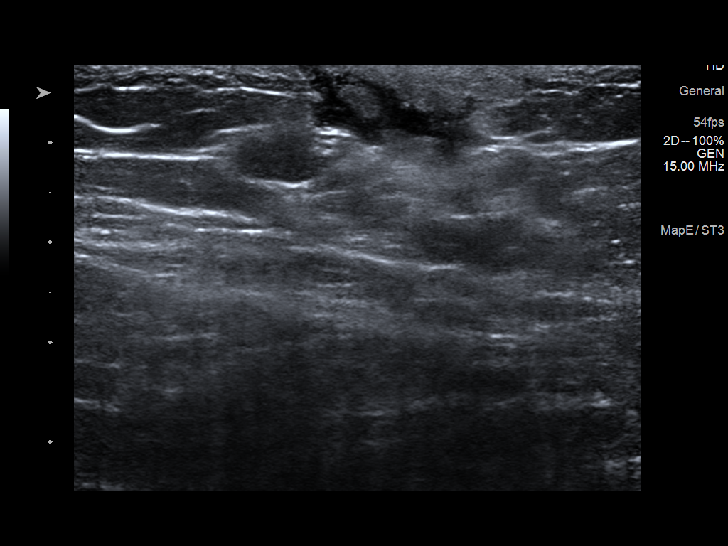
[im 4/27]
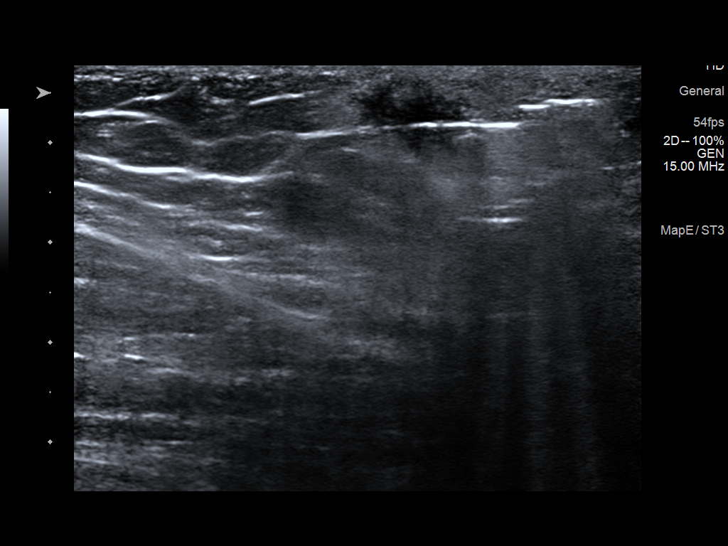
[im 6/27]
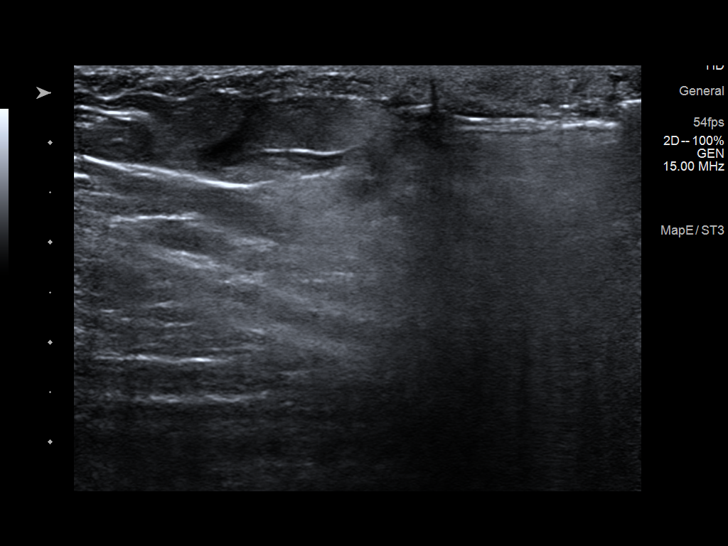
[im 8/27]
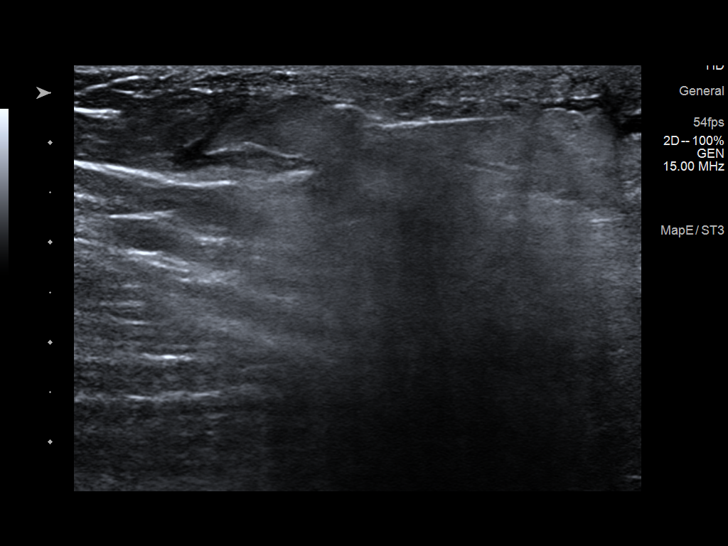
[im 10/27]
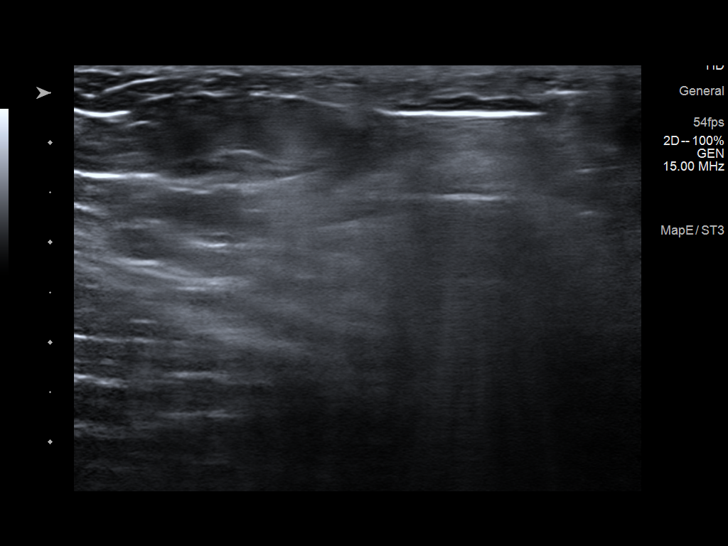
[im 12/27]
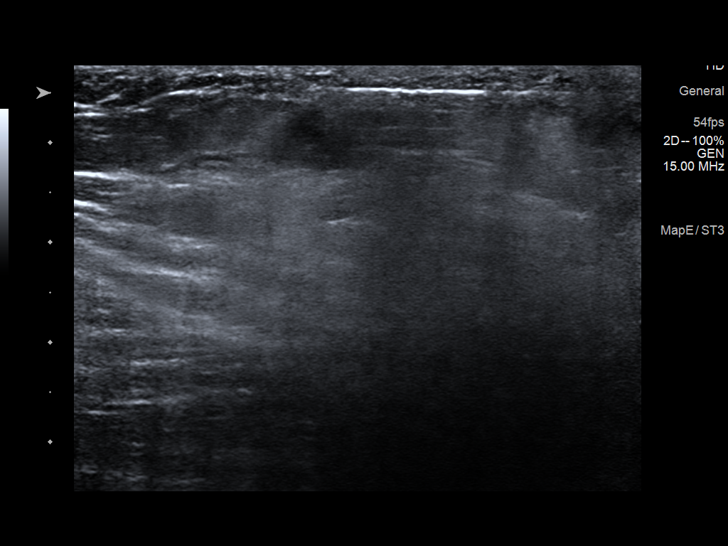
[im 15/27]
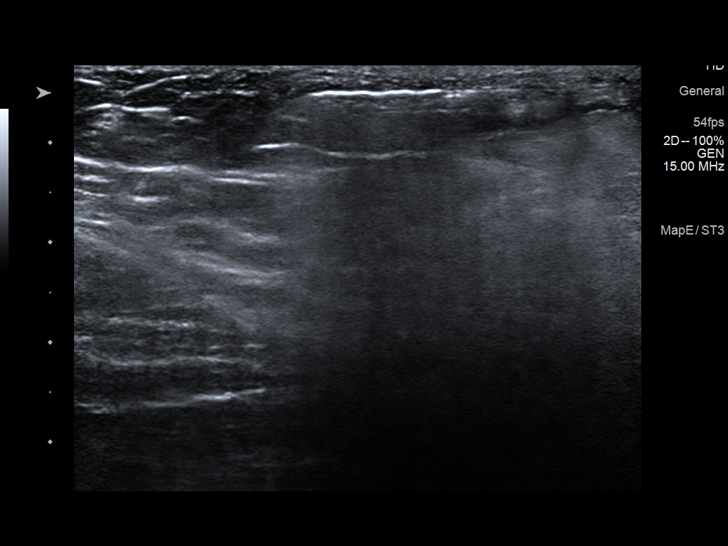
[im 17/27]
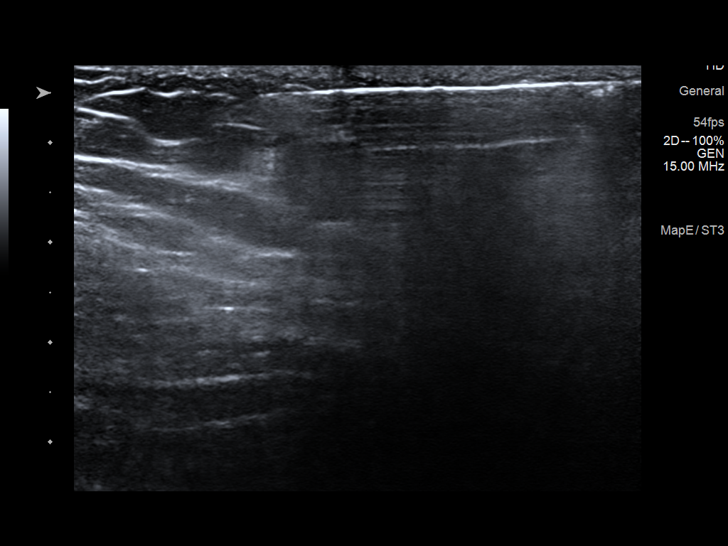
[im 19/27]
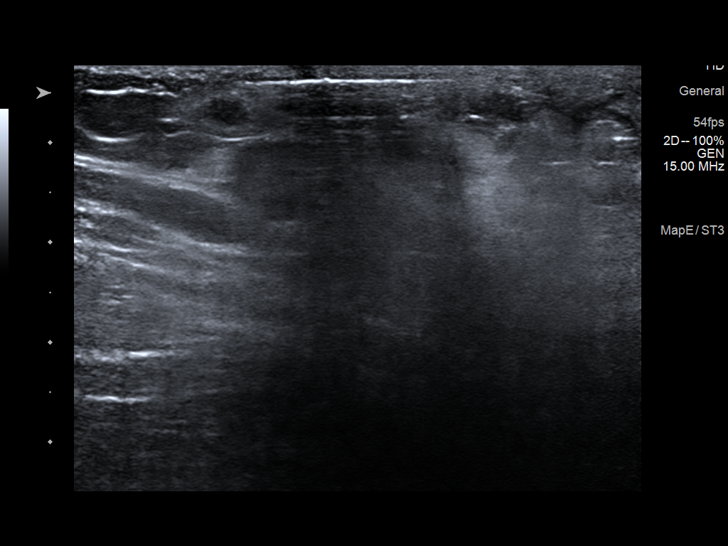
[im 21/27]
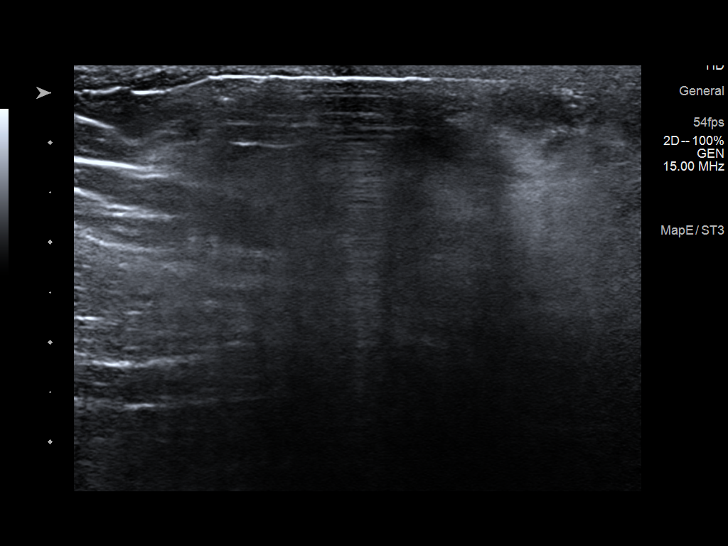
[im 23/27]
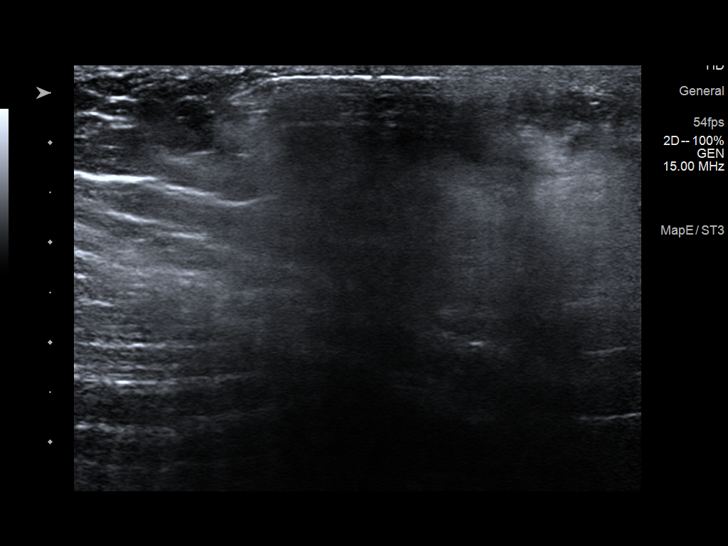
[im 25/27]
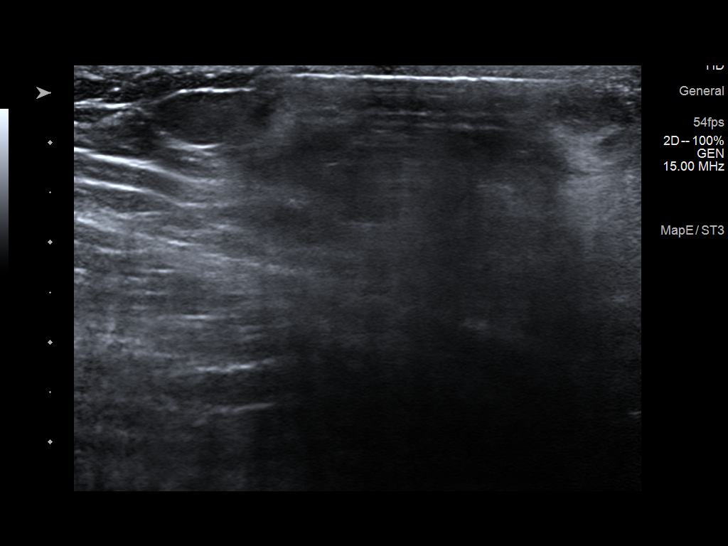

[12 of 25 positions shown; findings below may reference images not displayed]



Lesion quadrant: UPPER INNER QUADRANT RIGHT breast

Prior to the biopsy, mammographic views are performed to localize
the calcifications in the superficial UPPER INNER QUADRANT of the
RIGHT breast. Skin is marked to targeted ultrasound samples.

Using sterile technique and 1% Lidocaine as local anesthetic, under
direct ultrasound visualization, a 12 gauge Lutfe device was
used to perform biopsy of superficial subdermal tissue UPPER INNER
QUADRANT of the RIGHT breast, at the site of the skin marking and at
the site of previous stereotactic biopsy.

At conclusion of the procedure a ribbon shaped tissue marker clip
was deployed into the biopsy cavity.

Specimen radiograph is performed, demonstrating no definite
calcifications.

Follow up 2 view mammogram was performed and dictated separately.
IMPRESSION: Ultrasound guided biopsy of RIGHT breast. No apparent complications.

ADDENDUM:
PATHOLOGY ADDENDUM:

Pathology Breast, right, needle core biopsy, right breast
calcifications, suspect benign, ribbon clip

- FAT NECROSIS, FIBROSIS AND FOREIGN MATERIAL CONSISTENT WITH
PREVIOUS BIOPSY SITE CHANGES

- NO DEFINITIVE CALCIFICATIONS IDENTIFIED

Pathology concordance with imaging findings: Yes

Based on recent mammographic imaging and 2 benign biopsies, the
calcifications are felt to be benign dermal calcifications. I
discussed the results and options with the patient. We discussed the
option of surgery versus follow-up. In light of the likely benign
calcifications, the patient opts for imaging follow-up.

Recommendation: Recommend RIGHT diagnostic mammogram in 6 months.

At the request of the patient, I spoke with him by telephone on
01/24/2021 at [DATE]. He reports doing well after the biopsy.



Lesion quadrant: UPPER INNER QUADRANT RIGHT breast

Prior to the biopsy, mammographic views are performed to localize
the calcifications in the superficial UPPER INNER QUADRANT of the
RIGHT breast. Skin is marked to targeted ultrasound samples.

Using sterile technique and 1% Lidocaine as local anesthetic, under
direct ultrasound visualization, a 12 gauge Lutfe device was
used to perform biopsy of superficial subdermal tissue UPPER INNER
QUADRANT of the RIGHT breast, at the site of the skin marking and at
the site of previous stereotactic biopsy.

At conclusion of the procedure a ribbon shaped tissue marker clip
was deployed into the biopsy cavity.

Specimen radiograph is performed, demonstrating no definite
calcifications.

Follow up 2 view mammogram was performed and dictated separately.
IMPRESSION: Ultrasound guided biopsy of RIGHT breast. No apparent complications.

## 2022-10-31 IMAGING — MG MM BREAST LOCALIZATION CLIP
9 of 10 series · 9 of 18 positions shown · non-contrast
Comparison: Previous exam(s).

CLINICAL DATA: Status post ultrasound-guided core biopsy of the
RIGHT breast.

EXAM:
3D DIAGNOSTIC RIGHT MAMMOGRAM POST ULTRASOUND BIOPSY

[R CC (1 of 5)]
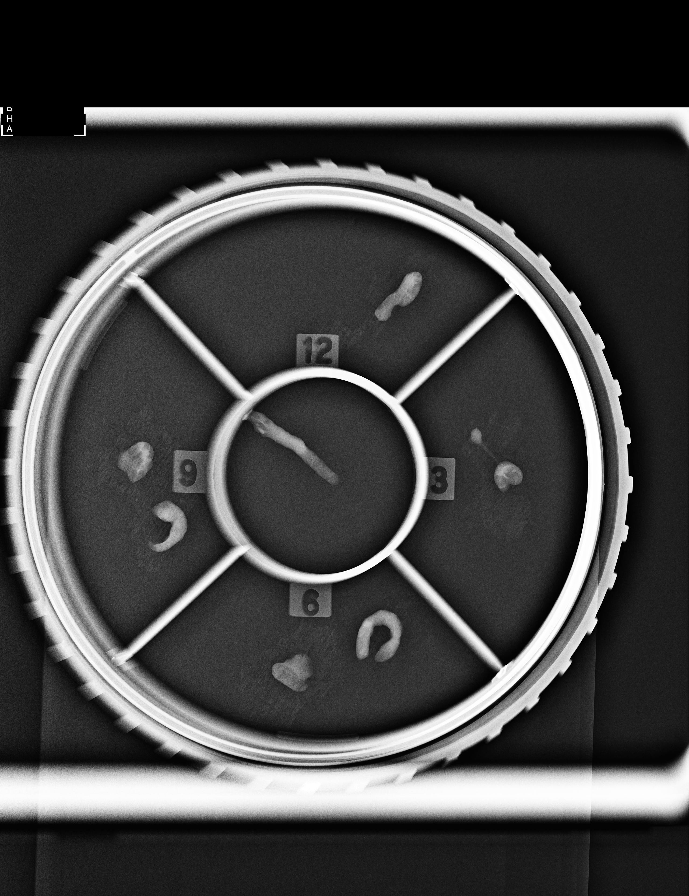

[R CC (2 of 5)]
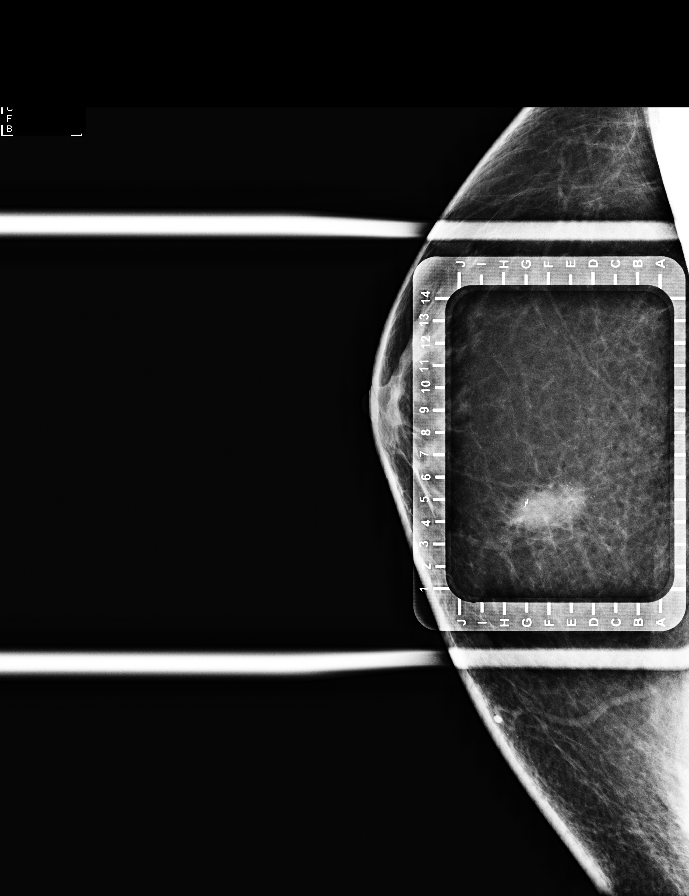

[R CC (3 of 5)]
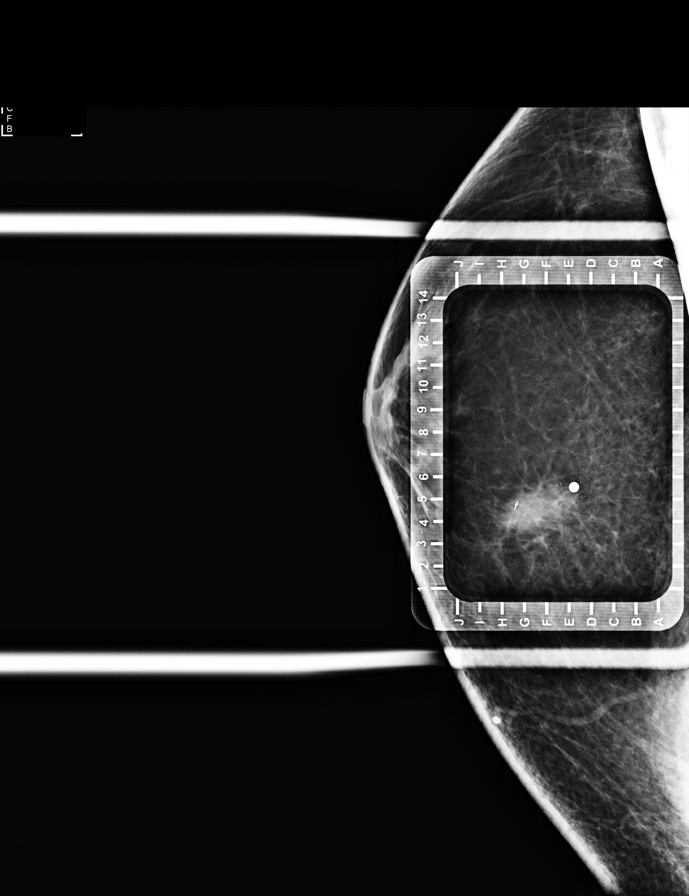

[R TAN]
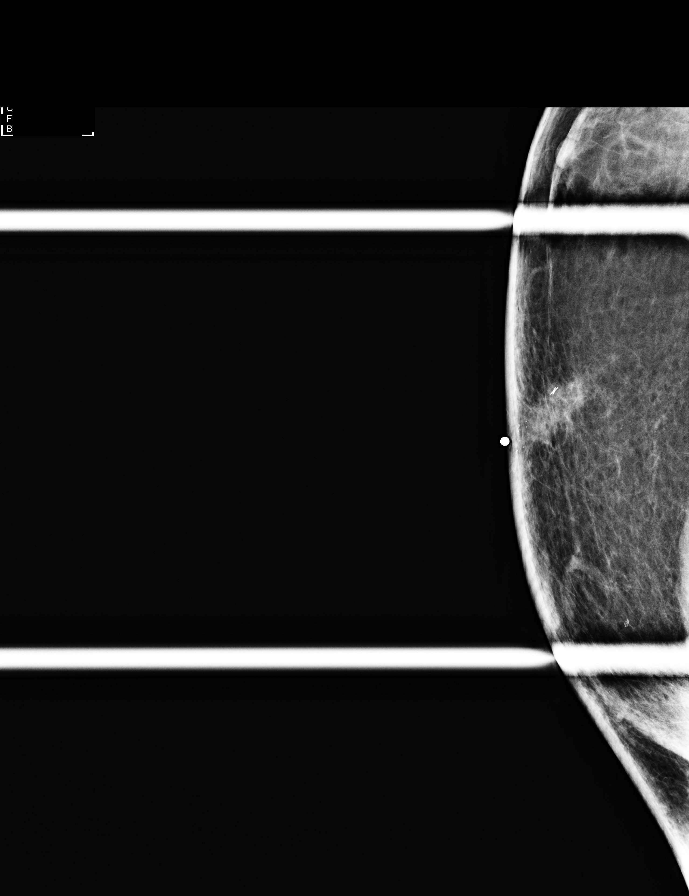

[R CC (4 of 5)]
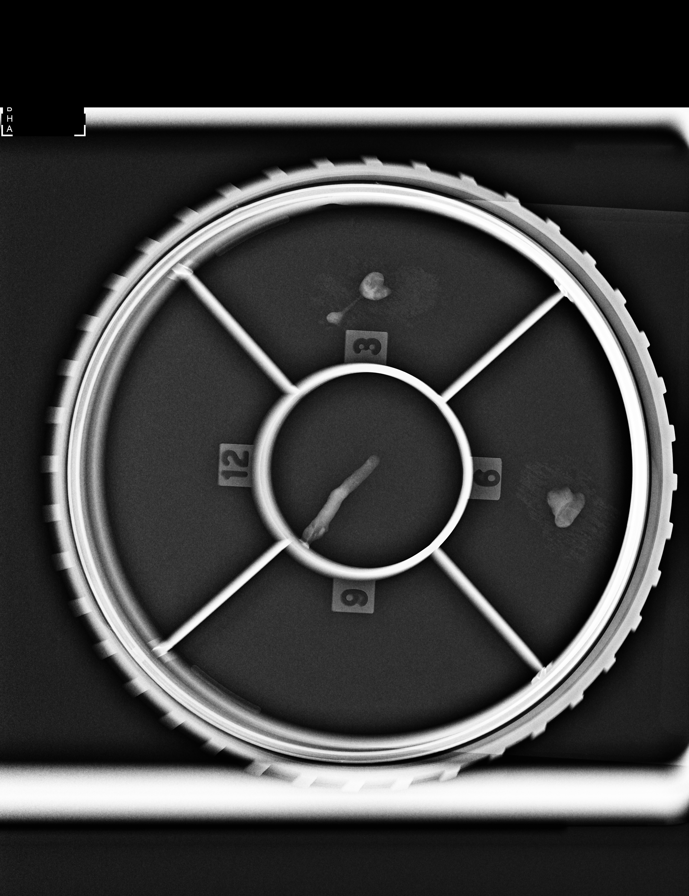

[R CC (5 of 5)]
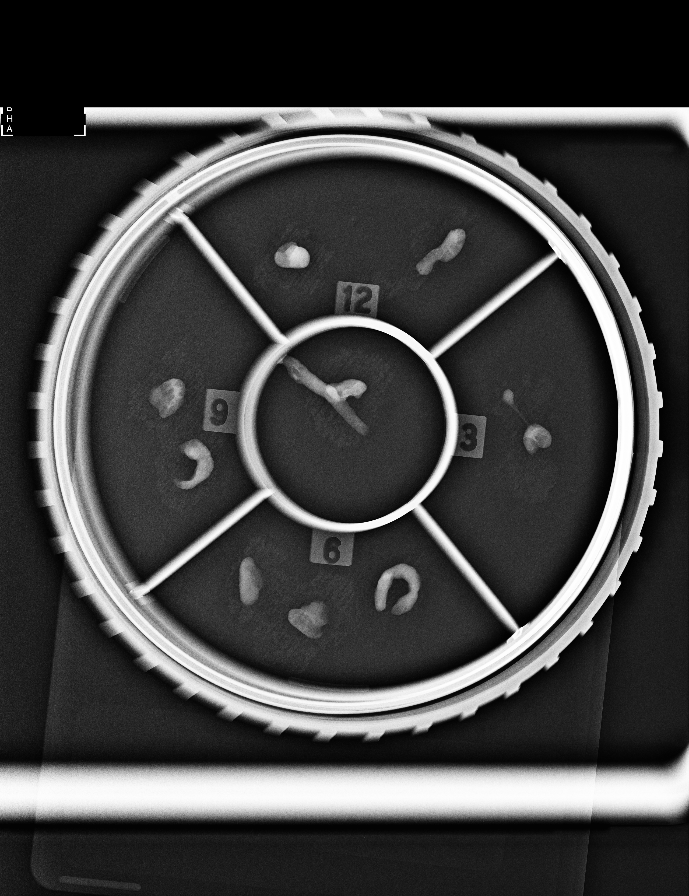

[R CC synth-2D]
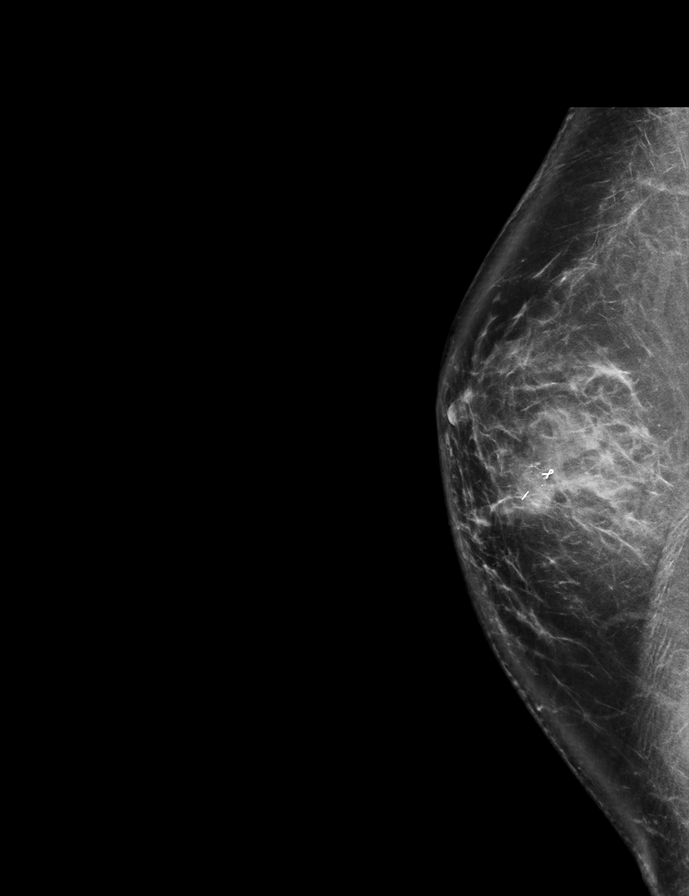

[R ML synth-2D]
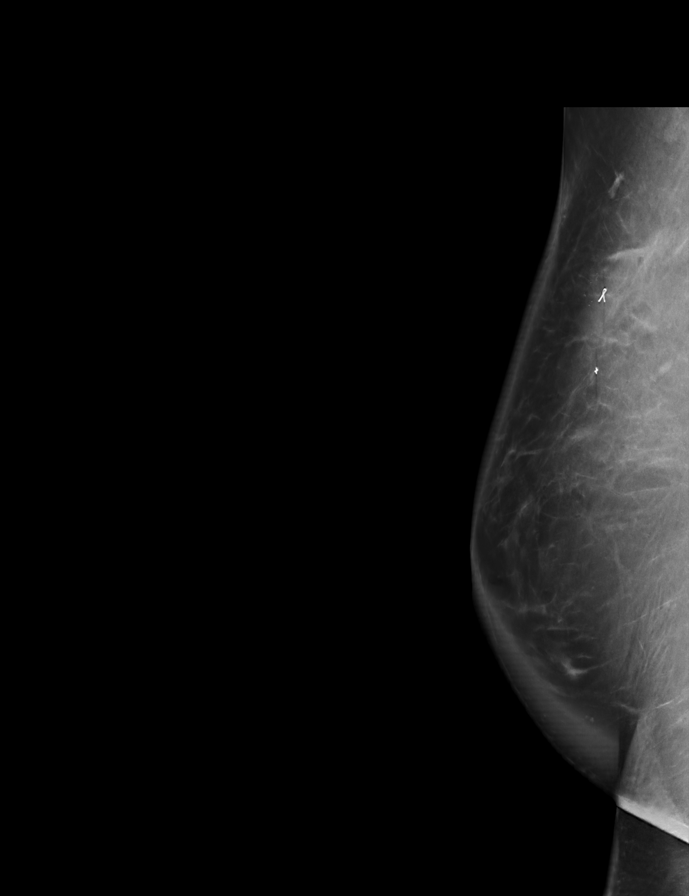

[R CC tomo · tomo slice 47/94.0]
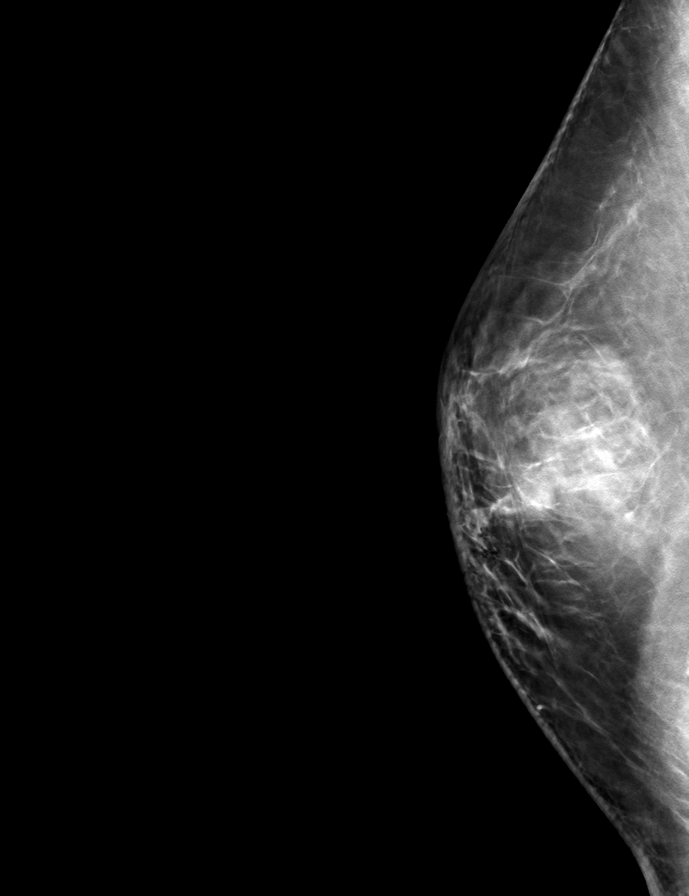

[9 of 18 positions shown; findings below may reference images not displayed]

FINDINGS: 3D Mammographic images were obtained following ultrasound guided
biopsy of UPPER INNER QUADRANT of the RIGHT breast and placement of
a ribbon shaped clip. The biopsy marking clip is in expected
position at the site of biopsy. Calcifications are adjacent to the
ribbon shaped clip.

Of note, on the images today, lucent centered calcifications are
noted, and calcifications are favored to be intradermal on imaging
today.
IMPRESSION: Appropriate positioning of the ribbon shaped biopsy marking clip at
the site of biopsy in the superficial UPPER INNER QUADRANT RIGHT
breast.

Final Assessment: Post Procedure Mammograms for Marker Placement

## 2022-11-06 ENCOUNTER — Other Ambulatory Visit: Payer: Self-pay

## 2022-11-06 ENCOUNTER — Encounter: Payer: Self-pay | Admitting: Internal Medicine

## 2022-11-06 ENCOUNTER — Ambulatory Visit (INDEPENDENT_AMBULATORY_CARE_PROVIDER_SITE_OTHER): Payer: Medicare HMO

## 2022-11-06 ENCOUNTER — Ambulatory Visit (INDEPENDENT_AMBULATORY_CARE_PROVIDER_SITE_OTHER): Payer: Medicare HMO | Admitting: Internal Medicine

## 2022-11-06 VITALS — BP 106/66 | HR 65 | Temp 97.9°F | Ht 71.0 in | Wt 266.6 lb

## 2022-11-06 VITALS — BP 88/55 | HR 82 | Temp 97.9°F | Ht 71.0 in | Wt 266.6 lb

## 2022-11-06 DIAGNOSIS — Z0000 Encounter for general adult medical examination without abnormal findings: Secondary | ICD-10-CM | POA: Diagnosis not present

## 2022-11-06 DIAGNOSIS — E559 Vitamin D deficiency, unspecified: Secondary | ICD-10-CM | POA: Diagnosis not present

## 2022-11-06 DIAGNOSIS — Z6837 Body mass index (BMI) 37.0-37.9, adult: Secondary | ICD-10-CM

## 2022-11-06 DIAGNOSIS — I10 Essential (primary) hypertension: Secondary | ICD-10-CM | POA: Diagnosis not present

## 2022-11-06 DIAGNOSIS — C61 Malignant neoplasm of prostate: Secondary | ICD-10-CM

## 2022-11-06 DIAGNOSIS — R7989 Other specified abnormal findings of blood chemistry: Secondary | ICD-10-CM

## 2022-11-06 DIAGNOSIS — E291 Testicular hypofunction: Secondary | ICD-10-CM

## 2022-11-06 NOTE — Progress Notes (Signed)
Subjective:   Timothy Harvey is a 67 y.o. male who presents for an Initial Medicare Annual Wellness Visit.  Visit Complete: In person   Review of Systems    Defer to PCP.        Objective:    Today's Vitals   11/06/22 1325  BP: (!) 88/55  Pulse: 82  Temp: 97.9 F (36.6 C)  TempSrc: Oral  SpO2: 100%  Weight: 266 lb 9.6 oz (120.9 kg)  Height: 5\' 11"  (1.803 m)   Body mass index is 37.18 kg/m.     11/06/2022    1:31 PM 11/06/2022    9:30 AM 05/22/2022    9:06 AM 12/04/2021    3:15 PM 09/17/2021   10:40 AM 11/19/2020    1:27 PM 10/08/2020    1:31 PM  Advanced Directives  Does Patient Have a Medical Advance Directive? No No Yes Yes No No No  Type of Best boy of [place];Living will Living will;Healthcare Power of Attorney     Does patient want to make changes to medical advance directive?   No - Patient declined      Copy of Healthcare Power of Attorney in Chart?   No - copy requested No - copy requested     Would patient like information on creating a medical advance directive? No - Patient declined No - Patient declined   No - Patient declined No - Patient declined No - Patient declined    Current Medications (verified) Outpatient Encounter Medications as of 11/06/2022  Medication Sig   cholecalciferol (VITAMIN D3) 25 MCG (1000 UNIT) tablet Take 1 tablet (1,000 Units total) by mouth daily.   Docusate Calcium (STOOL SOFTENER PO) Take 2 tablets by mouth daily at 6 (six) AM.   GARLIC PO Take 4,098 mg by mouth daily. (Patient not taking: Reported on 07/17/2022)   hydrochlorothiazide (HYDRODIURIL) 25 MG tablet Take 25 mg by mouth daily.   latanoprost (XALATAN) 0.005 % ophthalmic solution Place 1 drop into both eyes at bedtime.   losartan (COZAAR) 100 MG tablet TAKE 1 TABLET BY MOUTH EVERY DAY   Multiple Vitamins-Minerals (MENS MULTIVITAMIN PO) Take 1 tablet by mouth daily. (Patient not taking: Reported on 07/17/2022)   Omega-3 Fatty Acids (FISH OIL)  1000 MG CAPS Take 2,000 mg by mouth daily.   simvastatin (ZOCOR) 20 MG tablet TAKE 1 TABLET BY MOUTH EVERY DAY IN THE EVENING   terbinafine (LAMISIL) 250 MG tablet Take 1 tablet (250 mg total) by mouth daily.   No facility-administered encounter medications on file as of 11/06/2022.    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   TONSILLECTOMY     Family History  Problem Relation Age of Onset   Hypertension Mother    Stroke Mother    Prostate cancer Father 87   Breast cancer Sister 85   Prostate cancer Brother 39   Prostate cancer Brother 66  Colon polyps Neg Hx    Colon cancer Neg Hx    Esophageal cancer Neg Hx    Rectal cancer Neg Hx    Stomach cancer Neg Hx    Social History   Socioeconomic History   Marital status: Married    Spouse name: Not on file   Number of children: Not on file   Years of education: Not on file   Highest education level: Not on file  Occupational History   Not on file  Tobacco Use   Smoking status: Former    Current packs/day: 0.00    Types: Cigarettes    Quit date: 1997    Years since quitting: 27.5   Smokeless tobacco: Never  Vaping Use   Vaping status: Never Used  Substance and Sexual Activity   Alcohol use: Yes    Comment: Occasionally.   Drug use: Never   Sexual activity: Not on file  Other Topics Concern   Not on file  Social History Narrative   Not on file   Social Determinants of Health   Financial Resource Strain: Medium Risk (11/06/2022)   Overall  Financial Resource Strain (CARDIA)    Difficulty of Paying Living Expenses: Somewhat hard  Food Insecurity: No Food Insecurity (11/06/2022)   Hunger Vital Sign    Worried About Running Out of Food in the Last Year: Never true    Ran Out of Food in the Last Year: Never true  Transportation Needs: No Transportation Needs (11/06/2022)   PRAPARE - Administrator, Civil Service (Medical): No    Lack of Transportation (Non-Medical): No  Physical Activity: Insufficiently Active (11/06/2022)   Exercise Vital Sign    Days of Exercise per Week: 1 day    Minutes of Exercise per Session: 30 min  Stress: No Stress Concern Present (11/06/2022)   [employer] of Occupational Health - Occupational Stress Questionnaire    Feeling of Stress : Not at all  Social Connections: Moderately Isolated (11/06/2022)   Social Connection and Isolation Panel [NHANES]    Frequency of Communication with Friends and Family: Twice a week    Frequency of Social Gatherings with Friends and Family: Once a week    Attends Religious Services: Never    Database administrator or Organizations: No    Attends Engineer, structural: Never    Marital Status: Married    Tobacco Counseling Counseling given: Not Answered   Clinical Intake:  Pre-visit preparation completed: Yes  Pain : No/denies pain     BMI - recorded: 37.18 Nutritional Status: BMI > 30  Obese Nutritional Risks: None Diabetes: No  How often do you need to have someone help you when you read instructions, pamphlets, or other written materials from your doctor or pharmacy?: 1 - Never What is the last grade level you completed in school?: some college  Interpreter Needed?: No  Information entered by :: Crystalee Ventress, CMA 11/06/2022   Activities of Daily Living    11/06/2022    1:28 PM 11/06/2022    9:30 AM  In your present state of health, do you have any difficulty performing the following activities:  Hearing? 0 0  Vision? 0 0   Difficulty concentrating or making decisions? 0 0  Walking or climbing stairs? 0 0  Dressing or bathing? 0 0  Doing errands, shopping? 0 0  Preparing Food and eating ? N   Using the Toilet? N   In the past six months, have you  accidently leaked urine? N   Do you have problems with loss of bowel control? N   Managing your Medications? N   Managing your Finances? N   Housekeeping or managing your Housekeeping? N     Patient Care Team: Gust Rung, DO as PCP - General (Internal Medicine)  Indicate any recent Medical Services you may have received from other than [facility] providers in the past year (date may be approximate).     Assessment:   This is a routine wellness examination for Koehn.  Hearing/Vision screen No results found.  Dietary issues and exercise activities discussed:     Goals Addressed   None   Depression Screen    11/06/2022    1:42 PM 11/06/2022    9:30 AM 05/22/2022    9:04 AM 09/17/2021   10:38 AM 10/08/2020    2:46 PM  PHQ 2/9 Scores  PHQ - 2 Score 0 0 0 0 0  PHQ- 9 Score     2    Fall Risk    11/06/2022    1:31 PM 11/06/2022    9:29 AM 05/22/2022    9:04 AM 12/04/2021    3:15 PM 09/17/2021   10:37 AM  Fall Risk   Falls in the past year? 0 0 0 0 0  Number falls in past yr: 0 0 0 0   Injury with Fall? 0 0 0 0   Risk for fall due to : No Fall Risks No Fall Risks No Fall Risks No Fall Risks No Fall Risks  Follow up Falls evaluation completed;Falls prevention discussed Falls evaluation completed;Falls prevention discussed Falls evaluation completed;Falls prevention discussed Falls evaluation completed Falls evaluation completed    MEDICARE RISK AT HOME:   TIMED UP AND GO:  Was the test performed? No    Cognitive Function:        11/06/2022    1:42 PM  6CIT Screen  What Year? 0 points  What month? 0 points  What time? 0 points  Count back from 20 0 points  Months in reverse 0 points  Repeat phrase 0 points  Total Score 0 points     Immunizations Immunization History  Administered Date(s) Administered   Fluad Quad(high Dose 65+) 06/10/2022   Hepatitis B, ADULT 08/28/1995   PFIZER Comirnaty(Gray Top)Covid-19 Tri-Sucrose Vaccine 06/07/2019, 06/28/2019   PNEUMOCOCCAL CONJUGATE-20 06/10/2022   Pneumococcal Polysaccharide-23 07/16/2020   Tdap 10/15/1999, 08/21/2010, 10/20/2011    TDAP status: Due, Education has been provided regarding the importance of this vaccine. Advised may receive this vaccine at local pharmacy or Health Dept. Aware to provide a copy of the vaccination record if obtained from local pharmacy or Health Dept. Verbalized acceptance and understanding.  Flu Vaccine status: Up to date  Pneumococcal vaccine status: Up to date  Covid-19 vaccine status: Information provided on how to obtain vaccines.   Qualifies for Shingles Vaccine? Yes   Zostavax completed Yes   Shingrix Completed?: No.    Education has been provided regarding the importance of this vaccine. Patient has been advised to call insurance company to determine out of pocket expense if they have not yet received this vaccine. Advised may also receive vaccine at local pharmacy or Health Dept. Verbalized acceptance and understanding.  Screening Tests Health Maintenance  Topic Date Due   Zoster Vaccines- Shingrix (1 of 2) Never done   COVID-19 Vaccine (3 - Pfizer risk series) 07/26/2019   DTaP/Tdap/Td (4 - Td or Tdap) 10/19/2021   INFLUENZA VACCINE  11/27/2022   Medicare Annual Wellness (AWV)  11/06/2023   Colonoscopy  07/30/2029   Pneumonia Vaccine 40+ Years old  Completed   Hepatitis C Screening  Completed   HPV VACCINES  Aged Out    Health Maintenance  Health Maintenance Due  Topic Date Due   Zoster Vaccines- Shingrix (1 of 2) Never done   COVID-19 Vaccine (3 - Pfizer risk series) 07/26/2019   DTaP/Tdap/Td (4 - Td or Tdap) 10/19/2021    Colorectal cancer screening: Type of screening: Colonoscopy. Completed 07/31/2022. Repeat  every 7 years  Lung Cancer Screening: (Low Dose CT Chest recommended if Age 79-80 years, 20 pack-year currently smoking OR have quit w/in 15years.) does not qualify.   Lung Cancer Screening Referral: DEFER TO PCP.   Additional Screening:  Hepatitis C Screening: does qualify; Completed 09/18/2021  Vision Screening: Recommended annual ophthalmology exams for early detection of glaucoma and other disorders of the eye. Is the patient up to date with their annual eye exam?  No  Who is the provider or what is the name of the office in which the patient attends annual eye exams? Defer to PCP.  If pt is not established with a provider, would they like to be referred to a provider to establish care? Yes .   Dental Screening: Recommended annual dental exams for proper oral hygiene  Community Resource Referral / Chronic Care Management: CRR required this visit?  No   CCM required this visit?  No    Plan:     I have personally reviewed and noted the following in the patient's chart:   Medical and social history Use of alcohol, tobacco or illicit drugs  Current medications and supplements including opioid prescriptions. Patient is not currently taking opioid prescriptions. Functional ability and status Nutritional status Physical activity Advanced directives List of other physicians Hospitalizations, surgeries, and ER visits in previous 12 months Vitals Screenings to include cognitive, depression, and falls Referrals and appointments  In addition, I have reviewed and discussed with patient certain preventive protocols, quality metrics, and best practice recommendations. A written personalized care plan for preventive services as well as general preventive health recommendations were provided to patient.     Torres Hardenbrook, CMA   11/06/2022   After Visit Summary: (Mail) Due to this being a telephonic visit, the after visit summary with patients personalized plan was offered to patient via  mail   Nurse Notes: Face to Face.  Mr. Melucci , Thank you for taking time to come for your Medicare Wellness Visit. I appreciate your ongoing commitment to your health goals. Please review the following plan we discussed and let me know if I can assist you in the future.   These are the goals we discussed:  Goals   None     This is a list of the screening recommended for you and due dates:  Health Maintenance  Topic Date Due   Zoster (Shingles) Vaccine (1 of 2) Never done   COVID-19 Vaccine (3 - Pfizer risk series) 07/26/2019   DTaP/Tdap/Td vaccine (4 - Td or Tdap) 10/19/2021   Flu Shot  11/27/2022   Medicare Annual Wellness Visit  11/06/2023   Colon Cancer Screening  07/30/2029   Pneumonia Vaccine  Completed   Hepatitis C Screening  Completed   HPV Vaccine  Aged Out

## 2022-11-06 NOTE — Patient Instructions (Signed)
Health Maintenance, Male Adopting a healthy lifestyle and getting preventive care are important in promoting health and wellness. Ask your health care provider about: The right schedule for you to have regular tests and exams. Things you can do on your own to prevent diseases and keep yourself healthy. What should I know about diet, weight, and exercise? Eat a healthy diet  Eat a diet that includes plenty of vegetables, fruits, low-fat dairy products, and lean protein. Do not eat a lot of foods that are high in solid fats, added sugars, or sodium. Maintain a healthy weight Body mass index (BMI) is a measurement that can be used to identify possible weight problems. It estimates body fat based on height and weight. Your health care provider can help determine your BMI and help you achieve or maintain a healthy weight. Get regular exercise Get regular exercise. This is one of the most important things you can do for your health. Most adults should: Exercise for at least 150 minutes each week. The exercise should increase your heart rate and make you sweat (moderate-intensity exercise). Do strengthening exercises at least twice a week. This is in addition to the moderate-intensity exercise. Spend less time sitting. Even light physical activity can be beneficial. Watch cholesterol and blood lipids Have your blood tested for lipids and cholesterol at 69 years of age, then have this test every 5 years. You may need to have your cholesterol levels checked more often if: Your lipid or cholesterol levels are high. You are older than 69 years of age. You are at high risk for heart disease. What should I know about cancer screening? Many types of cancers can be detected early and may often be prevented. Depending on your health history and family history, you may need to have cancer screening at various ages. This may include screening for: Colorectal cancer. Prostate cancer. Skin cancer. Lung  cancer. What should I know about heart disease, diabetes, and high blood pressure? Blood pressure and heart disease High blood pressure causes heart disease and increases the risk of stroke. This is more likely to develop in people who have high blood pressure readings or are overweight. Talk with your health care provider about your target blood pressure readings. Have your blood pressure checked: Every 3-5 years if you are 18-39 years of age. Every year if you are 40 years old or older. If you are between the ages of 65 and 75 and are a current or former smoker, ask your health care provider if you should have a one-time screening for abdominal aortic aneurysm (AAA). Diabetes Have regular diabetes screenings. This checks your fasting blood sugar level. Have the screening done: Once every three years after age 45 if you are at a normal weight and have a low risk for diabetes. More often and at a younger age if you are overweight or have a high risk for diabetes. What should I know about preventing infection? Hepatitis B If you have a higher risk for hepatitis B, you should be screened for this virus. Talk with your health care provider to find out if you are at risk for hepatitis B infection. Hepatitis C Blood testing is recommended for: Everyone born from 1945 through 1965. Anyone with known risk factors for hepatitis C. Sexually transmitted infections (STIs) You should be screened each year for STIs, including gonorrhea and chlamydia, if: You are sexually active and are younger than 69 years of age. You are older than 69 years of age and your   health care provider tells you that you are at risk for this type of infection. Your sexual activity has changed since you were last screened, and you are at increased risk for chlamydia or gonorrhea. Ask your health care provider if you are at risk. Ask your health care provider about whether you are at high risk for HIV. Your health care provider  may recommend a prescription medicine to help prevent HIV infection. If you choose to take medicine to prevent HIV, you should first get tested for HIV. You should then be tested every 3 months for as long as you are taking the medicine. Follow these instructions at home: Alcohol use Do not drink alcohol if your health care provider tells you not to drink. If you drink alcohol: Limit how much you have to 0-2 drinks a day. Know how much alcohol is in your drink. In the [place] one drink equals one 12 oz bottle of beer (355 mL), one 5 oz glass of wine (148 mL), or one 1 oz glass of hard liquor (44 mL). Lifestyle Do not use any products that contain nicotine or tobacco. These products include cigarettes, chewing tobacco, and vaping devices, such as e-cigarettes. If you need help quitting, ask your health care provider. Do not use street drugs. Do not share needles. Ask your health care provider for help if you need support or information about quitting drugs. General instructions Schedule regular health, dental, and eye exams. Stay current with your vaccines. Tell your health care provider if: You often feel depressed. You have ever been abused or do not feel safe at home. Summary Adopting a healthy lifestyle and getting preventive care are important in promoting health and wellness. Follow your health care provider's instructions about healthy diet, exercising, and getting tested or screened for diseases. Follow your health care provider's instructions on monitoring your cholesterol and blood pressure. This information is not intended to replace advice given to you by your health care provider. Make sure you discuss any questions you have with your health care provider. Document Revised: 09/03/2020 Document Reviewed: 09/03/2020 Elsevier Patient Education  2024 Elsevier Inc.  

## 2022-11-07 DIAGNOSIS — C61 Malignant neoplasm of prostate: Secondary | ICD-10-CM | POA: Insufficient documentation

## 2022-11-07 LAB — VITAMIN D 25 HYDROXY (VIT D DEFICIENCY, FRACTURES): Vit D, 25-Hydroxy: 45 ng/mL (ref 30.0–100.0)

## 2022-11-07 NOTE — Assessment & Plan Note (Signed)
We discussed his low testosterone he is following with urology as part of the workup we have identified an elevated PSA and recent prostate biopsy shows low-grade prostate cancer he is okay with not treating his low testosterone.

## 2022-11-07 NOTE — Assessment & Plan Note (Signed)
Blood pressure was slightly low but asymptomatic.  He has been taking his antihypertensive losartan and hydrochlorothiazide.  Did do some work out in the heat yesterday.  Able to drink a bottle of water with improvement of his blood pressure in office.

## 2022-11-07 NOTE — Progress Notes (Signed)
Established Patient Office Visit  Subjective   Patient ID: Timothy Harvey, male    DOB: Aug 28, 1955  Age: 67 y.o. MRN: [id number]  Chief Complaint  Patient presents with   Check testerone level    Prostate CA  Shota returns today to follow-up with me.  We previously found low testosterone in the setting of painful gynecomastia.  He had an elevated PSA and I sent him to urology he has had a prostate biopsy with 1 core showing low-grade prostate cancer Gleason score of 6.  He currently plans on active surveillance of this.     Objective:     BP 106/66 (BP Location: Right Arm, Patient Position: Sitting, Cuff Size: Large)   Pulse 65   Temp 97.9 F (36.6 C) (Oral)   Ht 5\' 11"  (1.803 m)   Wt 266 lb 9.6 oz (120.9 kg)   SpO2 100% Comment: RA  BMI 37.18 kg/m  BP Readings from Last 3 Encounters:  11/06/22 (!) 88/55  11/06/22 106/66  07/31/22 109/69   Wt Readings from Last 3 Encounters:  11/06/22 266 lb 9.6 oz (120.9 kg)  11/06/22 266 lb 9.6 oz (120.9 kg)  07/31/22 270 lb (122.5 kg)      Physical Exam Constitutional:      Appearance: Normal appearance. He is obese.  Cardiovascular:     Rate and Rhythm: Normal rate and regular rhythm.  Pulmonary:     Effort: Pulmonary effort is normal.     Breath sounds: Normal breath sounds.  Neurological:     Mental Status: He is alert.  Psychiatric:        Mood and Affect: Mood normal.        Behavior: Behavior normal.      Results for orders placed or performed in visit on 11/06/22  Vitamin D (25 hydroxy)  Result Value Ref Range   Vit D, 25-Hydroxy 45.0 30.0 - 100.0 ng/mL    Last CBC Lab Results  Component Value Date   WBC 5.3 05/22/2022   HGB 12.8 (L) 05/22/2022   HCT 37.7 05/22/2022   MCV 93 05/22/2022   MCH 31.6 05/22/2022   RDW 11.9 05/22/2022   PLT 188 05/22/2022   Last metabolic panel Lab Results  Component Value Date   GLUCOSE 95 09/02/2022   NA 140 09/02/2022   K 3.9 09/02/2022   CL 97 09/02/2022   CO2  25 09/02/2022   BUN 15 09/02/2022   CREATININE 1.19 09/02/2022   EGFR 67 09/02/2022   CALCIUM 9.2 09/02/2022   PROT 6.8 09/02/2022   ALBUMIN 4.0 09/02/2022   LABGLOB 2.8 09/02/2022   AGRATIO 1.4 09/02/2022   BILITOT 0.3 09/02/2022   ALKPHOS 76 09/02/2022   AST 18 09/02/2022   ALT 18 09/02/2022   Last hemoglobin A1c No results found for: "HGBA1C"    The ASCVD Risk score (Arnett DK, et al., 2019) failed to calculate for the following reasons:   The valid systolic blood pressure range is 90 to 200 mmHg    Assessment & Plan:   Problem List Items Addressed This Visit       Cardiovascular and Mediastinum   Hypertension    Blood pressure was slightly low but asymptomatic.  He has been taking his antihypertensive losartan and hydrochlorothiazide.  Did do some work out in the heat yesterday.  Able to drink a bottle of water with improvement of his blood pressure in office.        Endocrine   Secondary male  hypogonadism    We discussed his low testosterone he is following with urology as part of the workup we have identified an elevated PSA and recent prostate biopsy shows low-grade prostate cancer he is okay with not treating his low testosterone.        Genitourinary   Prostate cancer managed with active surveillance [facility])    Since I last saw him he has had a prostate biopsy which revealed low-grade cancer with Gleason score of 6.  His urologist has recommended active surveillance and plans to follow-up in 6 months.  He did have some questions about this today which were all answered to the best of my ability overall reassuring him that he had a very low-grade prostate cancer.        Other   Morbid obesity (HCC)    Kutter has a BMI of 37.18 along with obesity related conditions of hypertension and hyperlipidemia.  Encouraged weight loss through caloric restriction.      Low vitamin D level - Primary    Has been taking his vitamin D supplement vitamin D rechecked today and  is in normal range continue daily supplement.      Relevant Orders   Vitamin D (25 hydroxy) (Completed)    Return in about 6 months (around 05/09/2023).    Gust Rung, DO

## 2022-11-07 NOTE — Assessment & Plan Note (Signed)
Has been taking his vitamin D supplement vitamin D rechecked today and is in normal range continue daily supplement.

## 2022-11-07 NOTE — Assessment & Plan Note (Addendum)
Timothy Harvey has a BMI of 37.18 along with obesity related conditions of hypertension and hyperlipidemia.  Encouraged weight loss through caloric restriction.

## 2022-11-07 NOTE — Assessment & Plan Note (Signed)
Since I last saw him he has had a prostate biopsy which revealed low-grade cancer with Gleason score of 6.  His urologist has recommended active surveillance and plans to follow-up in 6 months.  He did have some questions about this today which were all answered to the best of my ability overall reassuring him that he had a very low-grade prostate cancer.

## 2022-11-11 NOTE — Progress Notes (Signed)
Internal Medicine Clinic Attending  I reviewed the AWV findings.  I agree with the assessment, diagnosis, and plan of care documented in the AWV note.     

## 2022-11-18 ENCOUNTER — Encounter: Payer: Self-pay | Admitting: Podiatry

## 2022-12-02 ENCOUNTER — Telehealth: Payer: Self-pay | Admitting: Podiatry

## 2022-12-02 NOTE — Telephone Encounter (Signed)
Pt rquested a Rx refill on Lamisil. Please advise

## 2022-12-08 ENCOUNTER — Ambulatory Visit: Payer: Medicare HMO | Admitting: Podiatry

## 2022-12-08 DIAGNOSIS — L608 Other nail disorders: Secondary | ICD-10-CM

## 2022-12-08 DIAGNOSIS — B351 Tinea unguium: Secondary | ICD-10-CM

## 2022-12-09 NOTE — Progress Notes (Signed)
  Subjective:  Patient ID: Timothy Harvey, male    DOB: December 03, 1955,  MRN: [id number]  Chief Complaint  Patient presents with   Nail Problem    Pt has been using lamisil and he thinks that it helped,but only the pinky toe on the left foot has not cleared up.     67 y.o. male presents with the above complaint. History confirmed with patient.  Overall doing much better there are still some streaks in the nail  Objective:  Physical Exam: warm, good capillary refill, no trophic changes or ulcerative lesions, normal DP and PT pulses, normal sensory exam, and fungus has resolved he does have a melanonychia especially in the fifth toe noted still  Assessment:   1. Onychomycosis   2. Longitudinal melanonychia      Plan:  Patient was evaluated and treated and all questions answered.  Reviewed his progress he is doing much better there is not appear to be any active fungus.  I do not think that further antifungal therapy is necessary at this point.  The remaining discoloration is longitudinal melanonychia which we discussed.  He will follow-up me on an as-needed basis at this point.  Return if symptoms worsen or fail to improve.

## 2022-12-17 ENCOUNTER — Other Ambulatory Visit: Payer: Self-pay

## 2022-12-17 DIAGNOSIS — E785 Hyperlipidemia, unspecified: Secondary | ICD-10-CM

## 2022-12-17 MED ORDER — SIMVASTATIN 20 MG PO TABS: 20.0000 mg | ORAL_TABLET | Freq: Every day | ORAL | 3 refills | Status: DC

## 2022-12-30 ENCOUNTER — Other Ambulatory Visit: Payer: Self-pay

## 2022-12-30 MED ORDER — LOSARTAN POTASSIUM 100 MG PO TABS: 100.0000 mg | ORAL_TABLET | Freq: Every day | ORAL | 3 refills | Status: DC

## 2023-01-26 ENCOUNTER — Ambulatory Visit: Payer: Medicare HMO | Admitting: Internal Medicine

## 2023-01-26 ENCOUNTER — Telehealth: Payer: Self-pay | Admitting: *Deleted

## 2023-01-26 VITALS — BP 129/88 | HR 78 | Temp 98.0°F | Ht 71.0 in | Wt 270.3 lb

## 2023-01-26 DIAGNOSIS — Z87891 Personal history of nicotine dependence: Secondary | ICD-10-CM | POA: Diagnosis not present

## 2023-01-26 DIAGNOSIS — I10 Essential (primary) hypertension: Secondary | ICD-10-CM

## 2023-01-26 MED ORDER — LOSARTAN POTASSIUM 50 MG PO TABS: 50.0000 mg | ORAL_TABLET | Freq: Every day | ORAL | 1 refills | Status: DC

## 2023-01-26 NOTE — Progress Notes (Signed)
Subjective:   Patient ID: Timothy Harvey male   DOB: 04/01/1956 67 y.o.   MRN: [id number]  HPI: [name] is a 67 y.o. with PMH of HLD, obesity, and HTN presenting to clinic as a walk-in due to low blood pressure readings at home. Please see problem based assessment and plan for details of the visit.     Past Medical History:  Diagnosis Date   Cataract    bilateral sx   Glaucoma    bilateral   Hyperlipidemia    on meds   Hypertension    on meds   Onychomycosis    Retroperitoneal mass    Current Outpatient Medications  Medication Sig Dispense Refill   cholecalciferol (VITAMIN D3) 25 MCG (1000 UNIT) tablet Take 1 tablet (1,000 Units total) by mouth daily.     Docusate Calcium (STOOL SOFTENER PO) Take 2 tablets by mouth daily at 6 (six) AM.     GARLIC PO Take 4,098 mg by mouth daily. (Patient not taking: Reported on 07/17/2022)     latanoprost (XALATAN) 0.005 % ophthalmic solution Place 1 drop into both eyes at bedtime.     losartan (COZAAR) 50 MG tablet Take 1 tablet (50 mg total) by mouth daily. 90 tablet 1   Multiple Vitamins-Minerals (MENS MULTIVITAMIN PO) Take 1 tablet by mouth daily. (Patient not taking: Reported on 07/17/2022)     Omega-3 Fatty Acids (FISH OIL) 1000 MG CAPS Take 2,000 mg by mouth daily.     simvastatin (ZOCOR) 20 MG tablet Take 1 tablet (20 mg total) by mouth daily at 6 PM. 90 tablet 3   terbinafine (LAMISIL) 250 MG tablet Take 1 tablet (250 mg total) by mouth daily. 90 tablet 0   No current facility-administered medications for this visit.   Family History  Problem Relation Age of Onset   Hypertension Mother    Stroke Mother    Prostate cancer Father 25   Breast cancer Sister 52   Prostate cancer Brother 38   Prostate cancer Brother 65   Colon polyps Neg Hx    Colon cancer Neg Hx    Esophageal cancer Neg Hx    Rectal cancer Neg Hx    Stomach cancer Neg Hx    Social History   Socioeconomic History   Marital status: Married    Spouse  name: Not on file   Number of children: Not on file   Years of education: Not on file   Highest education level: Not on file  Occupational History   Not on file  Tobacco Use   Smoking status: Former    Current packs/day: 0.00    Types: Cigarettes    Quit date: 1997    Years since quitting: 27.7   Smokeless tobacco: Never  Vaping Use   Vaping status: Never Used  Substance and Sexual Activity   Alcohol use: Yes    Comment: Occasionally.   Drug use: Never   Sexual activity: Not on file  Other Topics Concern   Not on file  Social History Narrative   Not on file   Social Determinants of Health   Financial Resource Strain: Medium Risk (11/06/2022)   Overall Financial Resource Strain (CARDIA)    Difficulty of Paying Living Expenses: Somewhat hard  Food Insecurity: No Food Insecurity (11/06/2022)   Hunger Vital Sign    Worried About Running Out of Food in the Last Year: Never true    Ran Out of Food in the Last Year: Never true  Transportation Needs: No Transportation Needs (11/06/2022)   PRAPARE - Administrator, Civil Service (Medical): No    Lack of Transportation (Non-Medical): No  Physical Activity: Insufficiently Active (11/06/2022)   Exercise Vital Sign    Days of Exercise per Week: 1 day    Minutes of Exercise per Session: 30 min  Stress: No Stress Concern Present (11/06/2022)   [employer] of Occupational Health - Occupational Stress Questionnaire    Feeling of Stress : Not at all  Social Connections: Moderately Isolated (11/06/2022)   Social Connection and Isolation Panel [NHANES]    Frequency of Communication with Friends and Family: Twice a week    Frequency of Social Gatherings with Friends and Family: Once a week    Attends Religious Services: Never    Database administrator or Organizations: No    Attends Banker Meetings: Never    Marital Status: Married   Review of Systems: ROS negative except for what is noted on the assessment  and plan.  Objective:  Physical Exam: Vitals:   01/26/23 1415 01/26/23 1443 01/26/23 1445 01/26/23 1447  BP: 130/83 127/82 128/87 129/88  Pulse: 83 79 76 78  Temp: 98 F (36.7 C)     TempSrc: Oral     SpO2: 100%     Weight: 270 lb 4.8 oz (122.6 kg)     Height: 5\' 11"  (1.803 m)       General appearance: pleasant; well appearing Lungs: CTAB, no crackles, no wheeze, with normal respiratory effort and no intercostal retractions CV: RRR, S1, S2, no MRGs  Extremities: No peripheral edema Skin: Normal temperature, turgor and texture; no rash Psych: Appropriate affect Neuro: Alert and oriented to person and place, no focal deficit   Assessment & Plan:  Hypertension Pt reports of low blood pressure readings at home. He brought his bp readings from the last 5 days which has consistent readings of < 98-110/58-70s at home. He decided to stop his bp medicines 5 days ago because he had headaches and nausea along with slight dizziness and similar blood pressure readings.  Does not think eating or drinking anything really helped. He was on hydrochlorothiazide 25 mg and losartan 100 mg before he stopped and highly adherent. Since stopping, is unsure if his sx have been better. No vision changes, weight loss, diarrhea, vomiting or chest palpitations. He reports he drinks about 1 bottle of 12 oz water per day and takes fiber supplement for constipation. Has hx of low bp reading in clinic in May (88/55) which normalized after drinking water.  BP today in clinic was 130/80. Orthostatic bp was negative.   Assessment  -Chronic htn with consistent bp of <120/80 with and w/out meds. Although BP may have improved should continue to be on medication and slowly taper if needed. But avoid complete cessation. Questionable sx relating to hypotension, could be due to dehydration.   Plan  - Discontinue hydrochlorothiazide 25 mg  - Decrease dose of losartan to 50 mg daily  - continue water intake  - continue fiber  supplements -f/u in 1 month  - Keep BP log Patient discussed with Dr. Antony Contras

## 2023-01-26 NOTE — Assessment & Plan Note (Addendum)
Pt reports of low blood pressure readings at home. He brought his bp readings from the last 5 days which has consistent readings of < 98-110/58-70s at home. He decided to stop his bp medicines 5 days ago because he had headaches and nausea along with slight dizziness and similar blood pressure readings.  Does not think eating or drinking anything really helped. He was on hydrochlorothiazide 25 mg and losartan 100 mg before he stopped and highly adherent. Since stopping, is unsure if his sx have been better. No vision changes, weight loss, diarrhea, vomiting or chest palpitations. He reports he drinks about 1 bottle of 12 oz water per day and takes fiber supplement for constipation. Has hx of low bp reading in clinic in May (88/55) which normalized after drinking water.  BP today in clinic was 130/80. Orthostatic bp was negative.   Assessment  -Chronic htn with consistent bp of <120/80 with and w/out meds. Although BP may have improved should continue to be on medication and slowly taper if needed. But avoid complete cessation. Questionable sx relating to hypotension, could be due to dehydration.   Plan  - Discontinue hydrochlorothiazide 25 mg  - Decrease dose of losartan to 50 mg daily  - continue water intake  - continue fiber supplements -f/u in 1 month  - Keep BP log

## 2023-01-26 NOTE — Telephone Encounter (Signed)
Walk-in. States he has been having low BP's. He has them on his phone. C/o slight h/a's and occasional dizziness. He wants to if he still needs to take his BP meds. I talked to Dr Ned Card who agrees to see pt; pt added to her schedule.

## 2023-01-26 NOTE — Patient Instructions (Signed)
Thank you, [name] for allowing [place] to provide your care today. Today we discussed:  Low blood pressures Your blood pressure was excellent in the clinic today and did not drop when we had you in different positions. Since your blood pressures are low at home and you're having headaches/dizziness, we are going to STOP your hydrochlorothiazide, like you already did Continue to take losartan everyday, except take 1/2 a pill of the 100 mg tablets that you have. New dose is 50 mg daily. Continue to check your blood pressure and bring them in to your next office visit in ~4 weeks Please stay hydrated and drink more water! Being dehydrated can also cause your symptoms  I have ordered the following labs for you:  Lab Orders  No laboratory test(s) ordered today       Referrals ordered today:   Referral Orders  No referral(s) requested today     I have ordered the following medication/changed the following medications:   Stop the following medications: There are no discontinued medications.   Start the following medications: No orders of the defined types were placed in this encounter.    Follow up:  4 weeks     Should you have any questions or concerns please call the internal medicine clinic at [phone].     Elza Rafter, D.O. [facility]

## 2023-01-27 NOTE — Progress Notes (Signed)
Internal Medicine Clinic Attending  Case discussed with the resident at the time of the visit.  We reviewed the resident's history and exam and pertinent patient test results.  I agree with the assessment, diagnosis, and plan of care documented in the resident's note.  

## 2023-01-29 ENCOUNTER — Encounter: Payer: Self-pay | Admitting: Internal Medicine

## 2023-02-25 ENCOUNTER — Other Ambulatory Visit: Payer: Self-pay

## 2023-02-25 ENCOUNTER — Ambulatory Visit (INDEPENDENT_AMBULATORY_CARE_PROVIDER_SITE_OTHER): Payer: Medicare HMO | Admitting: Internal Medicine

## 2023-02-25 ENCOUNTER — Encounter: Payer: Self-pay | Admitting: Internal Medicine

## 2023-02-25 VITALS — BP 110/71 | HR 96 | Temp 98.0°F | Ht 71.0 in | Wt 269.2 lb

## 2023-02-25 DIAGNOSIS — I10 Essential (primary) hypertension: Secondary | ICD-10-CM | POA: Diagnosis not present

## 2023-02-25 NOTE — Patient Instructions (Addendum)
Thank you, [name] for allowing [place] to provide your care today.   Your blood pressure looked great today. Keep up the good work.  I have ordered the following medication/changed the following medications:   Stop the following medications: There are no discontinued medications.   Start the following medications: No orders of the defined types were placed in this encounter.    Follow up: 3 months  We look forward to seeing you next time. Please call our clinic at [phone] if you have any questions or concerns. The best time to call is Monday-Friday from 9am-4pm, but there is someone available 24/7. If after hours or the weekend, call the main hospital number and ask for the Internal Medicine Resident On-Call. If you need medication refills, please notify your pharmacy one week in advance and they will send [place] a request.   Thank you for trusting me with your care. Wishing you the best!   Rudene Christians, DO [facility]

## 2023-02-25 NOTE — Progress Notes (Unsigned)
Subjective:  CC: blood pressure  HPI:  [name] is a 67 y.o. male with a past medical history stated below and presents today for hypertension. He was seen 01/26/23 for symptomatic hypotension with dizziness and nausea but no syncopal episodes. Orthostatics were negative. Hydrochlorothiazide was discontinued and losartan dose decreased to 50 mg daily.   Please see problem based assessment and plan for additional details.  Past Medical History:  Diagnosis Date   Cataract    bilateral sx   Glaucoma    bilateral   Hyperlipidemia    on meds   Hypertension    on meds   Onychomycosis    Retroperitoneal mass     Current Outpatient Medications on File Prior to Visit  Medication Sig Dispense Refill   cholecalciferol (VITAMIN D3) 25 MCG (1000 UNIT) tablet Take 1 tablet (1,000 Units total) by mouth daily.     Docusate Calcium (STOOL SOFTENER PO) Take 2 tablets by mouth daily at 6 (six) AM.     GARLIC PO Take 9,629 mg by mouth daily. (Patient not taking: Reported on 07/17/2022)     latanoprost (XALATAN) 0.005 % ophthalmic solution Place 1 drop into both eyes at bedtime.     losartan (COZAAR) 50 MG tablet Take 1 tablet (50 mg total) by mouth daily. 90 tablet 1   Multiple Vitamins-Minerals (MENS MULTIVITAMIN PO) Take 1 tablet by mouth daily. (Patient not taking: Reported on 07/17/2022)     Omega-3 Fatty Acids (FISH OIL) 1000 MG CAPS Take 2,000 mg by mouth daily.     simvastatin (ZOCOR) 20 MG tablet Take 1 tablet (20 mg total) by mouth daily at 6 PM. 90 tablet 3   terbinafine (LAMISIL) 250 MG tablet Take 1 tablet (250 mg total) by mouth daily. 90 tablet 0   No current facility-administered medications on file prior to visit.    Family History  Problem Relation Age of Onset   Hypertension Mother    Stroke Mother    Prostate cancer Father 60   Breast cancer Sister 63   Prostate cancer Brother 82   Prostate cancer Brother 5   Colon polyps Neg Hx    Colon cancer Neg Hx     Esophageal cancer Neg Hx    Rectal cancer Neg Hx    Stomach cancer Neg Hx     Social History   Socioeconomic History   Marital status: Married    Spouse name: Not on file   Number of children: Not on file   Years of education: Not on file   Highest education level: Not on file  Occupational History   Not on file  Tobacco Use   Smoking status: Former    Current packs/day: 0.00    Types: Cigarettes    Quit date: 1997    Years since quitting: 27.8   Smokeless tobacco: Never  Vaping Use   Vaping status: Never Used  Substance and Sexual Activity   Alcohol use: Yes    Comment: Occasionally.   Drug use: Never   Sexual activity: Not on file  Other Topics Concern   Not on file  Social History Narrative   Not on file   Social Determinants of Health   Financial Resource Strain: Medium Risk (11/06/2022)   Overall Financial Resource Strain (CARDIA)    Difficulty of Paying Living Expenses: Somewhat hard  Food Insecurity: No Food Insecurity (11/06/2022)   Hunger Vital Sign    Worried About Running Out of Food in the  Last Year: Never true    Ran Out of Food in the Last Year: Never true  Transportation Needs: No Transportation Needs (11/06/2022)   PRAPARE - Administrator, Civil Service (Medical): No    Lack of Transportation (Non-Medical): No  Physical Activity: Insufficiently Active (11/06/2022)   Exercise Vital Sign    Days of Exercise per Week: 1 day    Minutes of Exercise per Session: 30 min  Stress: No Stress Concern Present (11/06/2022)   [employer] of Occupational Health - Occupational Stress Questionnaire    Feeling of Stress : Not at all  Social Connections: Moderately Isolated (11/06/2022)   Social Connection and Isolation Panel [NHANES]    Frequency of Communication with Friends and Family: Twice a week    Frequency of Social Gatherings with Friends and Family: Once a week    Attends Religious Services: Never    Database administrator or  Organizations: No    Attends Banker Meetings: Never    Marital Status: Married  Catering manager Violence: Not At Risk (11/06/2022)   Humiliation, Afraid, Rape, and Kick questionnaire    Fear of Current or Ex-Partner: No    Emotionally Abused: No    Physically Abused: No    Sexually Abused: No    Review of Systems: ROS negative except for what is noted on the assessment and plan.  Objective:  There were no vitals filed for this visit.  Physical Exam: Constitutional: well-appearing *** sitting in ***, in no acute distress HENT: normocephalic atraumatic, mucous membranes moist Eyes: conjunctiva non-erythematous Neck: supple Cardiovascular: regular rate and rhythm, no m/r/g Pulmonary/Chest: normal work of breathing on room air, lungs clear to auscultation bilaterally Abdominal: soft, non-tender, non-distended MSK: normal bulk and tone Neurological: alert & oriented x 3, 5/5 strength in bilateral upper and lower extremities, normal gait Skin: warm and dry Psych: ***     Assessment & Plan:  No problem-specific Assessment & Plan notes found for this encounter.    Patient {[id number]::"discussed with","seen with"} Dr. {[id number]::"[id number]","G. Hoffman","Mullen","Narendra","Machen","Vincent","Guilloud","Lau"}   [employer], D.O. [facility]  PGY-3 Pager: [phone]  Phone: [phone] Date 02/25/2023  Time 1:19 PM

## 2023-02-26 NOTE — Progress Notes (Signed)
 Internal Medicine Clinic Attending  Case discussed with the resident physician at the time of the visit.  We reviewed the patient's history, exam, and pertinent patient test results.  I agree with the assessment, diagnosis, and plan of care documented in the resident's note.

## 2023-02-26 NOTE — Assessment & Plan Note (Signed)
BP Readings from Last 3 Encounters:  02/25/23 110/71  01/26/23 129/88  11/06/22 (!) 88/55   BP well controlled after decrease in medication at last visit. He is taking losartan 50 mg. Home blood pressure readings are also well controlled. He no longer has dizziness with standing. -continue losartan 50 mg -repeat BMP due 05/25

## 2023-03-18 DIAGNOSIS — K689 Other disorders of retroperitoneum: Secondary | ICD-10-CM | POA: Diagnosis not present

## 2023-03-18 DIAGNOSIS — R1900 Intra-abdominal and pelvic swelling, mass and lump, unspecified site: Secondary | ICD-10-CM | POA: Diagnosis not present

## 2023-03-18 DIAGNOSIS — K760 Fatty (change of) liver, not elsewhere classified: Secondary | ICD-10-CM | POA: Diagnosis not present

## 2023-04-16 DIAGNOSIS — C61 Malignant neoplasm of prostate: Secondary | ICD-10-CM | POA: Diagnosis not present

## 2023-04-23 DIAGNOSIS — C61 Malignant neoplasm of prostate: Secondary | ICD-10-CM | POA: Diagnosis not present

## 2023-04-28 ENCOUNTER — Other Ambulatory Visit: Payer: Self-pay | Admitting: Urology

## 2023-04-28 DIAGNOSIS — C61 Malignant neoplasm of prostate: Secondary | ICD-10-CM

## 2023-05-07 ENCOUNTER — Encounter: Payer: Self-pay | Admitting: Urology

## 2023-05-13 ENCOUNTER — Ambulatory Visit: Payer: Medicare HMO | Admitting: Student

## 2023-05-13 VITALS — BP 109/77 | HR 89 | Temp 97.4°F | Ht 71.0 in | Wt 271.9 lb

## 2023-05-13 DIAGNOSIS — M545 Low back pain, unspecified: Secondary | ICD-10-CM | POA: Diagnosis not present

## 2023-05-13 MED ORDER — NAPROXEN 500 MG PO TABS: 500.0000 mg | ORAL_TABLET | Freq: Two times a day (BID) | ORAL | 0 refills | Status: DC

## 2023-05-13 NOTE — Progress Notes (Signed)
 CC: Back Pain  HPI:  [name] is a 68 y.o. male living with a history stated below and presents today for back pain. Please see problem based assessment and plan for additional details.  Past Medical History:  Diagnosis Date   Cataract    bilateral sx   Glaucoma    bilateral   Hyperlipidemia    on meds   Hypertension    on meds   Onychomycosis    Retroperitoneal mass     Current Outpatient Medications on File Prior to Visit  Medication Sig Dispense Refill   cholecalciferol (VITAMIN D3) 25 MCG (1000 UNIT) tablet Take 1 tablet (1,000 Units total) by mouth daily.     Docusate Calcium  (STOOL SOFTENER PO) Take 2 tablets by mouth daily at 6 (six) AM.     GARLIC PO Take 2,400 mg by mouth daily. (Patient not taking: Reported on 07/17/2022)     latanoprost (XALATAN) 0.005 % ophthalmic solution Place 1 drop into both eyes at bedtime.     losartan  (COZAAR ) 50 MG tablet Take 1 tablet (50 mg total) by mouth daily. 90 tablet 1   Multiple Vitamins-Minerals (MENS MULTIVITAMIN PO) Take 1 tablet by mouth daily. (Patient not taking: Reported on 07/17/2022)     Omega-3 Fatty Acids (FISH OIL) 1000 MG CAPS Take 2,000 mg by mouth daily.     simvastatin  (ZOCOR ) 20 MG tablet Take 1 tablet (20 mg total) by mouth daily at 6 PM. 90 tablet 3   terbinafine  (LAMISIL ) 250 MG tablet Take 1 tablet (250 mg total) by mouth daily. 90 tablet 0   No current facility-administered medications on file prior to visit.    Family History  Problem Relation Age of Onset   Hypertension Mother    Stroke Mother    Prostate cancer Father 98   Breast cancer Sister 29   Prostate cancer Brother 74   Prostate cancer Brother 41   Colon polyps Neg Hx    Colon cancer Neg Hx    Esophageal cancer Neg Hx    Rectal cancer Neg Hx    Stomach cancer Neg Hx     Social History   Socioeconomic History   Marital status: Married    Spouse name: Not on file   Number of children: Not on file   Years of education: Not  on file   Highest education level: Not on file  Occupational History   Not on file  Tobacco Use   Smoking status: Former    Current packs/day: 0.00    Types: Cigarettes    Quit date: 1997    Years since quitting: 28.0   Smokeless tobacco: Never  Vaping Use   Vaping status: Never Used  Substance and Sexual Activity   Alcohol use: Yes    Comment: Occasionally.   Drug use: Never   Sexual activity: Not on file  Other Topics Concern   Not on file  Social History Narrative   Not on file   Social Drivers of Health   Financial Resource Strain: Medium Risk (11/06/2022)   Overall Financial Resource Strain (CARDIA)    Difficulty of Paying Living Expenses: Somewhat hard  Food Insecurity: No Food Insecurity (11/06/2022)   Hunger Vital Sign    Worried About Running Out of Food in the Last Year: Never true    Ran Out of Food in the Last Year: Never true  Transportation Needs: No Transportation Needs (11/06/2022)   PRAPARE - Transportation    Lack of  Transportation (Medical): No    Lack of Transportation (Non-Medical): No  Physical Activity: Insufficiently Active (11/06/2022)   Exercise Vital Sign    Days of Exercise per Week: 1 day    Minutes of Exercise per Session: 30 min  Stress: No Stress Concern Present (11/06/2022)   [employer] of Occupational Health - Occupational Stress Questionnaire    Feeling of Stress : Not at all  Social Connections: Moderately Isolated (11/06/2022)   Social Connection and Isolation Panel [NHANES]    Frequency of Communication with Friends and Family: Twice a week    Frequency of Social Gatherings with Friends and Family: Once a week    Attends Religious Services: Never    Database administrator or Organizations: No    Attends Banker Meetings: Never    Marital Status: Married  Catering manager Violence: Not At Risk (11/06/2022)   Humiliation, Afraid, Rape, and Kick questionnaire    Fear of Current or Ex-Partner: No    Emotionally  Abused: No    Physically Abused: No    Sexually Abused: No    Review of Systems: ROS negative except for what is noted on the assessment and plan.  Vitals:   05/13/23 1549  BP: 109/77  Pulse: 89  Temp: (!) 97.4 F (36.3 C)  TempSrc: Oral  SpO2: 98%  Weight: 271 lb 14.4 oz (123.3 kg)  Height: 5\' 11"  (1.803 m)    Physical Exam: Constitutional: well-appearing male,  in no acute distress Cardiovascular: regular rate and rhythm, no m/r/g Pulmonary/Chest: normal work of breathing on room air, lungs clear to auscultation bilaterally Abdominal: soft, non-tender, non-distended MSK: no tenderness to palpation in low back, however pt in clear pain when ambulating. Straight leg test negative.  Neurological: alert & oriented x 3, 5/5 strength in bilateral upper and lower extremities  Assessment & Plan:   Low back pain Patient presents for a weeklong history of low back pain.  He denies any falls, or trauma to the area.  It starts in his L4/L5 area and radiates down to his right gluteus.  It does not go further down his leg.  He has never experienced this type of pain before, and has tried Advil for the pain however it does not help.  He denies any red flag symptoms such as fevers, chills, weakness, bowel incontinence.  The pain is worse when moving from sitting to standing position, and he describes as a sharp stabbing pain.  On my exam, I was able to elicit the pain on to slight leg raise of his right leg, however the pain did not radiate down his leg.  It does seem radicular in nature, differentials could include degenerative disc disease, sacroiliitis.  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  He does have a history of prostate cancer as well which is very low risk with Gleason score of 6.  Currently, will treat conservatively with rest, heating pads, and trial of 1 month of naproxen , and referral to  physical therapy.  If pain does not get better low threshold for imaging of low back.  Plan: - Naproxen  500 mg twice a day - Low threshold for imaging if pain does not improve - Referral to physical therapy  Patient discussed with Dr. Guilloud  Kaira Stringfield, M.D. [facility], PGY-2 Pager: [phone] Date 05/13/2023 Time 4:36 PM

## 2023-05-13 NOTE — Assessment & Plan Note (Signed)
 Patient presents for a weeklong history of low back pain.  He denies any falls, or trauma to the area.  It starts in his L4/L5 area and radiates down to his right gluteus.  It does not go further down his leg.  He has never experienced this type of pain before, and has tried Advil for the pain however it does not help.  He denies any red flag symptoms such as fevers, chills, weakness, bowel incontinence.  The pain is worse when moving from sitting to standing position, and he describes as a sharp stabbing pain.  On my exam, I was able to elicit the pain on to slight leg raise of his right leg, however the pain did not radiate down his leg.  It does seem radicular in nature, differentials could include degenerative disc disease, sacroiliitis.  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  He does have a history of prostate cancer as well which is very low risk with Gleason score of 6.  Currently, will treat conservatively with rest, heating pads, and trial of 1 month of naproxen , and referral to physical therapy.  If pain does not get better low threshold for imaging of low back.  Plan: - Naproxen  500 mg twice a day - Low threshold for imaging if pain does not improve - Referral to physical therapy

## 2023-05-13 NOTE — Patient Instructions (Addendum)
 Thank you so much for coming to the clinic today!   I am sending in a short course of naproxen , which should help with the pain. I am also sending in a referral to the physical therapist who could help with the pain. If you do have worsening of symptoms, please come back and schedule an appointment as soon as possible and we will get images of your back.   If you have any questions please feel free to the call the clinic at anytime at [phone]. It was a pleasure seeing you!  Best, Dr. Siana Panameno

## 2023-05-15 NOTE — Progress Notes (Signed)
 Internal Medicine Clinic Attending  Case discussed with the resident at the time of the visit.  We reviewed the resident's history and exam and pertinent patient test results.  I agree with the assessment, diagnosis, and plan of care documented in the resident's note.

## 2023-06-12 ENCOUNTER — Ambulatory Visit
Admission: RE | Admit: 2023-06-12 | Discharge: 2023-06-12 | Disposition: A | Discharge status: Home / Self Care | Payer: Medicare HMO | Source: Ambulatory Visit | Attending: Urology | Admitting: Urology

## 2023-06-12 DIAGNOSIS — C61 Malignant neoplasm of prostate: Secondary | ICD-10-CM

## 2023-06-12 DIAGNOSIS — R972 Elevated prostate specific antigen [PSA]: Secondary | ICD-10-CM | POA: Diagnosis not present

## 2023-06-12 MED ORDER — GADOPICLENOL 0.5 MMOL/ML IV SOLN
10.0000 mL | Freq: Once | INTRAVENOUS | Status: AC | PRN
  Administered 2023-06-12: 10 mL via INTRAVENOUS

## 2023-06-15 ENCOUNTER — Encounter: Payer: Self-pay | Admitting: Student

## 2023-06-15 ENCOUNTER — Ambulatory Visit (INDEPENDENT_AMBULATORY_CARE_PROVIDER_SITE_OTHER): Payer: Medicare HMO | Admitting: Student

## 2023-06-15 VITALS — BP 102/70 | HR 84 | Temp 97.5°F | Ht 71.0 in | Wt 270.1 lb

## 2023-06-15 DIAGNOSIS — C61 Malignant neoplasm of prostate: Secondary | ICD-10-CM | POA: Diagnosis not present

## 2023-06-15 DIAGNOSIS — R7301 Impaired fasting glucose: Secondary | ICD-10-CM | POA: Diagnosis not present

## 2023-06-15 DIAGNOSIS — Z0000 Encounter for general adult medical examination without abnormal findings: Secondary | ICD-10-CM

## 2023-06-15 DIAGNOSIS — I10 Essential (primary) hypertension: Secondary | ICD-10-CM | POA: Diagnosis not present

## 2023-06-15 DIAGNOSIS — Z131 Encounter for screening for diabetes mellitus: Secondary | ICD-10-CM | POA: Diagnosis not present

## 2023-06-15 DIAGNOSIS — M545 Low back pain, unspecified: Secondary | ICD-10-CM | POA: Diagnosis not present

## 2023-06-15 NOTE — Assessment & Plan Note (Signed)
BP Readings from Last 3 Encounters:  06/15/23 102/70  05/13/23 109/77  02/25/23 110/71   Blood pressure today is well-controlled.  He is currently taking losartan 50 mg daily and tolerating this well.  No new changes today.

## 2023-06-15 NOTE — Assessment & Plan Note (Signed)
Has a known history of low-grade prostate cancer with a Gleason score of 6.  He recently had a prostate MRI which showed a PI-RADS category 4 lesion of the left posterolateral peripheral zone at the base of the prostate.  He denies any urinary symptoms, fevers, chills, or weight loss.  He follows regularly with his urologist.  I shared the results of this MRI with him but recommended he discuss it further with his urologist at his next follow-up appointment.

## 2023-06-15 NOTE — Assessment & Plan Note (Addendum)
Patient is here for a 1 month follow-up for acute lower back pain.  He was previously seen in our clinic on 1/15 and at that time was treated with oral short course of naproxen and referred to physical therapy.  He states that his symptoms have completely resolved.  He does not have any further back pain or other concerns today.

## 2023-06-15 NOTE — Assessment & Plan Note (Addendum)
He does not have a hemoglobin A1c on file, so we will check it today.  Addendum: Hemoglobin A1c was 6.5.  Called the patient and spoke with him about this.  He would like to focus on dietary changes at this time and avoid medication assistance.  I told him we will follow-up in 3 months and recheck his A1c and see how it is going.

## 2023-06-15 NOTE — Patient Instructions (Signed)
Thank you, Mr.Theron B Lehner for allowing [place] to provide your care today. Today we discussed your back pain and prostate cancer.    Since you have never had hemoglobin A1c, we will check that today.  Contact you as soon as we have the results.  As we discussed, your prostate MRI did show findings consistent with prostate cancer.  Please follow-up with your urologist for further management of this.  I am glad that your back pain has resolved.  I have ordered the following labs for you:   Lab Orders         Hemoglobin A1c      Follow up: 6 months   We look forward to seeing you next time. Please call our clinic at [phone] if you have any questions or concerns. The best time to call is Monday-Friday from 9am-4pm, but there is someone available 24/7. If after hours or the weekend, call the main hospital number and ask for the Internal Medicine Resident On-Call. If you need medication refills, please notify your pharmacy one week in advance and they will send [place] a request.   Thank you for trusting me with your care. Wishing you the best!   Annett Fabian, MD [facility]

## 2023-06-15 NOTE — Progress Notes (Signed)
CC: back pain follow up  HPI:  [name] is a 68 y.o. male living with a history stated below and presents today for back pain. Please see problem based assessment and plan for additional details.  Past Medical History:  Diagnosis Date   Cataract    bilateral sx   Glaucoma    bilateral   Hyperlipidemia    on meds   Hypertension    on meds   Onychomycosis    Retroperitoneal mass     Current Outpatient Medications on File Prior to Visit  Medication Sig Dispense Refill   cholecalciferol (VITAMIN D3) 25 MCG (1000 UNIT) tablet Take 1 tablet (1,000 Units total) by mouth daily.     Docusate Calcium (STOOL SOFTENER PO) Take 2 tablets by mouth daily at 6 (six) AM.     GARLIC PO Take 8,119 mg by mouth daily. (Patient not taking: Reported on 07/17/2022)     latanoprost (XALATAN) 0.005 % ophthalmic solution Place 1 drop into both eyes at bedtime.     losartan (COZAAR) 50 MG tablet Take 1 tablet (50 mg total) by mouth daily. 90 tablet 1   Multiple Vitamins-Minerals (MENS MULTIVITAMIN PO) Take 1 tablet by mouth daily. (Patient not taking: Reported on 07/17/2022)     naproxen (NAPROSYN) 500 MG tablet Take 1 tablet (500 mg total) by mouth 2 (two) times daily with a meal. 60 tablet 0   Omega-3 Fatty Acids (FISH OIL) 1000 MG CAPS Take 2,000 mg by mouth daily.     simvastatin (ZOCOR) 20 MG tablet Take 1 tablet (20 mg total) by mouth daily at 6 PM. 90 tablet 3   terbinafine (LAMISIL) 250 MG tablet Take 1 tablet (250 mg total) by mouth daily. 90 tablet 0   No current facility-administered medications on file prior to visit.    Family History  Problem Relation Age of Onset   Hypertension Mother    Stroke Mother    Prostate cancer Father 91   Breast cancer Sister 19   Prostate cancer Brother 50   Prostate cancer Brother 49   Colon polyps Neg Hx    Colon cancer Neg Hx    Esophageal cancer Neg Hx    Rectal cancer Neg Hx    Stomach cancer Neg Hx     Social History    Socioeconomic History   Marital status: Married    Spouse name: Not on file   Number of children: Not on file   Years of education: Not on file   Highest education level: Not on file  Occupational History   Not on file  Tobacco Use   Smoking status: Former    Current packs/day: 0.00    Types: Cigarettes    Quit date: 1997    Years since quitting: 28.1   Smokeless tobacco: Never  Vaping Use   Vaping status: Never Used  Substance and Sexual Activity   Alcohol use: Yes    Comment: Occasionally.   Drug use: Never   Sexual activity: Not on file  Other Topics Concern   Not on file  Social History Narrative   Not on file   Social Drivers of Health   Financial Resource Strain: Medium Risk (06/15/2023)   Overall Financial Resource Strain (CARDIA)    Difficulty of Paying Living Expenses: Somewhat hard  Food Insecurity: No Food Insecurity (06/15/2023)   Hunger Vital Sign    Worried About Running Out of Food in the Last Year: Never true  Ran Out of Food in the Last Year: Never true  Transportation Needs: No Transportation Needs (06/15/2023)   PRAPARE - Administrator, Civil Service (Medical): No    Lack of Transportation (Non-Medical): No  Physical Activity: Insufficiently Active (06/15/2023)   Exercise Vital Sign    Days of Exercise per Week: 2 days    Minutes of Exercise per Session: 30 min  Stress: No Stress Concern Present (06/15/2023)   [employer] of Occupational Health - Occupational Stress Questionnaire    Feeling of Stress : Not at all  Social Connections: Moderately Integrated (06/15/2023)   Social Connection and Isolation Panel [NHANES]    Frequency of Communication with Friends and Family: More than three times a week    Frequency of Social Gatherings with Friends and Family: Twice a week    Attends Religious Services: More than 4 times per year    Active Member of [employer] or Organizations: No    Attends Banker Meetings: Never     Marital Status: Married  Catering manager Violence: Not At Risk (06/15/2023)   Humiliation, Afraid, Rape, and Kick questionnaire    Fear of Current or Ex-Partner: No    Emotionally Abused: No    Physically Abused: No    Sexually Abused: No   Review of Systems: ROS negative except for what is noted on the assessment and plan.  Vitals:   06/15/23 1349  BP: 102/70  Pulse: 84  Temp: (!) 97.5 F (36.4 C)  TempSrc: Oral  SpO2: 100%  Weight: 270 lb 1.6 oz (122.5 kg)  Height: 5\' 11"  (1.803 m)   Physical Exam: Well-appearing man in no acute distress Heart rate and rhythm regular, no murmurs, no lower extremity edema Lungs clear to auscultation bilaterally, normal respiratory rate and effort  Assessment & Plan:   Patient discussed with Dr. Antony Contras  Low back pain Patient is here for a 1 month follow-up for acute lower back pain.  He was previously seen in our clinic on 1/15 and at that time was treated with oral short course of naproxen and referred to physical therapy.  He states that his symptoms have completely resolved.  He does not have any further back pain or other concerns today.  Healthcare maintenance He does not have a hemoglobin A1c on file, so we will check it today.  Hypertension BP Readings from Last 3 Encounters:  06/15/23 102/70  05/13/23 109/77  02/25/23 110/71   Blood pressure today is well-controlled.  He is currently taking losartan 50 mg daily and tolerating this well.  No new changes today.  Prostate cancer managed with active surveillance [facility]) Has a known history of low-grade prostate cancer with a Gleason score of 6.  He recently had a prostate MRI which showed a PI-RADS category 4 lesion of the left posterolateral peripheral zone at the base of the prostate.  He denies any urinary symptoms, fevers, chills, or weight loss.  He follows regularly with his urologist.  I shared the results of this MRI with him but recommended he discuss it further with his  urologist at his next follow-up appointment.  Annett Fabian, MD [facility], PGY-1 Phone: [phone] Date 06/15/2023 Time 2:23 PM

## 2023-06-16 LAB — HEMOGLOBIN A1C
Est. average glucose Bld gHb Est-mCnc: 140 mg/dL
Hgb A1c MFr Bld: 6.5 % — ABNORMAL HIGH (ref 4.8–5.6)

## 2023-06-18 NOTE — Progress Notes (Signed)
 Internal Medicine Clinic Attending  Case discussed with the resident at the time of the visit.  We reviewed the resident's history and exam and pertinent patient test results.  I agree with the assessment, diagnosis, and plan of care documented in the resident's note.

## 2023-06-24 ENCOUNTER — Other Ambulatory Visit: Payer: Self-pay | Admitting: Internal Medicine

## 2023-06-24 NOTE — Telephone Encounter (Signed)
 Medication sent to pharmacy

## 2023-07-01 DIAGNOSIS — C61 Malignant neoplasm of prostate: Secondary | ICD-10-CM | POA: Diagnosis not present

## 2023-07-09 DIAGNOSIS — C61 Malignant neoplasm of prostate: Secondary | ICD-10-CM | POA: Diagnosis not present

## 2023-07-13 ENCOUNTER — Other Ambulatory Visit: Payer: Self-pay | Admitting: Student

## 2023-07-20 DIAGNOSIS — N429 Disorder of prostate, unspecified: Secondary | ICD-10-CM | POA: Diagnosis not present

## 2023-07-20 DIAGNOSIS — C61 Malignant neoplasm of prostate: Secondary | ICD-10-CM | POA: Diagnosis not present

## 2023-10-21 DIAGNOSIS — Z8546 Personal history of malignant neoplasm of prostate: Secondary | ICD-10-CM | POA: Diagnosis not present

## 2023-10-21 DIAGNOSIS — Z08 Encounter for follow-up examination after completed treatment for malignant neoplasm: Secondary | ICD-10-CM | POA: Diagnosis not present

## 2023-10-21 DIAGNOSIS — C61 Malignant neoplasm of prostate: Secondary | ICD-10-CM | POA: Diagnosis not present

## 2023-10-21 DIAGNOSIS — I10 Essential (primary) hypertension: Secondary | ICD-10-CM | POA: Diagnosis not present

## 2023-10-21 DIAGNOSIS — R300 Dysuria: Secondary | ICD-10-CM | POA: Diagnosis not present

## 2023-12-24 ENCOUNTER — Other Ambulatory Visit: Payer: Self-pay | Admitting: *Deleted

## 2023-12-24 DIAGNOSIS — E785 Hyperlipidemia, unspecified: Secondary | ICD-10-CM

## 2023-12-24 MED ORDER — LOSARTAN POTASSIUM 50 MG PO TABS
50.0000 mg | ORAL_TABLET | Freq: Every day | ORAL | 1 refills | Status: AC
Start: 2023-12-24 — End: ?

## 2023-12-24 MED ORDER — SIMVASTATIN 20 MG PO TABS: 20.0000 mg | ORAL_TABLET | Freq: Every day | ORAL | 3 refills | Status: AC

## 2023-12-24 NOTE — Addendum Note (Signed)
 Addended by: Navaeh Kehres C on: 12/24/2023 03:43 PM   Modules accepted: Orders

## 2023-12-24 NOTE — Telephone Encounter (Signed)
 Message left on recorder for pt to call back to schedule next avail appt with pcp

## 2024-01-13 ENCOUNTER — Encounter

## 2024-02-16 ENCOUNTER — Ambulatory Visit: Admitting: Podiatry

## 2024-02-16 VITALS — Ht 71.0 in | Wt 270.0 lb

## 2024-02-16 DIAGNOSIS — L608 Other nail disorders: Secondary | ICD-10-CM

## 2024-02-21 NOTE — Progress Notes (Signed)
  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    68 y.o. male presents with the above complaint. History confirmed with patient.  He returns with concern for color changes again  Objective:  Physical Exam: warm, good capillary refill, no trophic changes or ulcerative lesions, normal DP and PT pulses, normal sensory exam, and no clear recurrence of discoloration due to melanonychia Assessment:   1. Longitudinal melanonychia       Plan:  Patient was evaluated and treated and all questions answered.  Clinically still does not show any new evidence of fungal infection his melanonychia are stable.  Do not recommend any further antifungal treatment.  Follow-up as needed.  Return if symptoms worsen or fail to improve.
